# Patient Record
Sex: Female | Born: 1965 | Race: White | Hispanic: No | Marital: Married | Smoking: Never smoker
Health system: Southern US, Community
[De-identification: ages and names within clinical notes are randomized; demographics above are authoritative.]

## PROBLEM LIST (undated history)

## (undated) DIAGNOSIS — R000 Tachycardia, unspecified: Secondary | ICD-10-CM

## (undated) DIAGNOSIS — D229 Melanocytic nevi, unspecified: Secondary | ICD-10-CM

## (undated) DIAGNOSIS — E7800 Pure hypercholesterolemia, unspecified: Secondary | ICD-10-CM

## (undated) DIAGNOSIS — R7989 Other specified abnormal findings of blood chemistry: Secondary | ICD-10-CM

## (undated) DIAGNOSIS — F419 Anxiety disorder, unspecified: Secondary | ICD-10-CM

## (undated) DIAGNOSIS — I10 Essential (primary) hypertension: Secondary | ICD-10-CM

## (undated) DIAGNOSIS — F319 Bipolar disorder, unspecified: Secondary | ICD-10-CM

## (undated) DIAGNOSIS — J189 Pneumonia, unspecified organism: Secondary | ICD-10-CM

## (undated) DIAGNOSIS — M67959 Unspecified disorder of synovium and tendon, unspecified thigh: Secondary | ICD-10-CM

## (undated) DIAGNOSIS — Z8719 Personal history of other diseases of the digestive system: Secondary | ICD-10-CM

## (undated) DIAGNOSIS — G4700 Insomnia, unspecified: Secondary | ICD-10-CM

## (undated) DIAGNOSIS — K589 Irritable bowel syndrome without diarrhea: Secondary | ICD-10-CM

## (undated) DIAGNOSIS — K219 Gastro-esophageal reflux disease without esophagitis: Secondary | ICD-10-CM

## (undated) DIAGNOSIS — E210 Primary hyperparathyroidism: Secondary | ICD-10-CM

## (undated) HISTORY — DX: Other specified abnormal findings of blood chemistry: R79.89

## (undated) HISTORY — PX: SALPINGOOPHORECTOMY: SHX82

## (undated) HISTORY — DX: Gastro-esophageal reflux disease without esophagitis: K21.9

## (undated) HISTORY — DX: Melanocytic nevi, unspecified: D22.9

## (undated) HISTORY — DX: Essential (primary) hypertension: I10

## (undated) HISTORY — PX: TONSILLECTOMY: SUR1361

## (undated) HISTORY — DX: Primary hyperparathyroidism: E21.0

## (undated) HISTORY — DX: Pure hypercholesterolemia, unspecified: E78.00

## (undated) HISTORY — PX: OOPHORECTOMY: SHX86

## (undated) HISTORY — DX: Bipolar disorder, unspecified: F31.9

## (undated) HISTORY — DX: Irritable bowel syndrome, unspecified: K58.9

---

## 2003-06-17 ENCOUNTER — Other Ambulatory Visit: Admission: RE | Admit: 2003-06-17 | Discharge: 2003-06-17 | Payer: Self-pay | Admitting: Family Medicine

## 2004-06-19 ENCOUNTER — Other Ambulatory Visit: Admission: RE | Admit: 2004-06-19 | Discharge: 2004-06-19 | Payer: Self-pay | Admitting: Family Medicine

## 2005-06-27 ENCOUNTER — Other Ambulatory Visit: Admission: RE | Admit: 2005-06-27 | Discharge: 2005-06-27 | Payer: Self-pay | Admitting: Family Medicine

## 2006-07-08 ENCOUNTER — Other Ambulatory Visit: Admission: RE | Admit: 2006-07-08 | Discharge: 2006-07-08 | Payer: Self-pay | Admitting: Family Medicine

## 2007-07-14 ENCOUNTER — Other Ambulatory Visit: Admission: RE | Admit: 2007-07-14 | Discharge: 2007-07-14 | Payer: Self-pay | Admitting: Family Medicine

## 2009-07-25 ENCOUNTER — Other Ambulatory Visit: Admission: RE | Admit: 2009-07-25 | Discharge: 2009-07-25 | Payer: Self-pay | Admitting: Family Medicine

## 2010-07-26 ENCOUNTER — Other Ambulatory Visit: Admission: RE | Admit: 2010-07-26 | Discharge: 2010-07-26 | Payer: Self-pay | Admitting: Family Medicine

## 2011-03-26 ENCOUNTER — Encounter: Payer: Self-pay | Admitting: Family Medicine

## 2011-03-26 ENCOUNTER — Inpatient Hospital Stay (INDEPENDENT_AMBULATORY_CARE_PROVIDER_SITE_OTHER)
Admission: RE | Admit: 2011-03-26 | Discharge: 2011-03-26 | Disposition: A | Discharge status: Home / Self Care | Payer: BC Managed Care – PPO | Source: Ambulatory Visit | Attending: Family Medicine | Admitting: Family Medicine

## 2011-03-26 DIAGNOSIS — S335XXA Sprain of ligaments of lumbar spine, initial encounter: Secondary | ICD-10-CM

## 2011-03-28 ENCOUNTER — Telehealth (INDEPENDENT_AMBULATORY_CARE_PROVIDER_SITE_OTHER): Payer: Self-pay | Admitting: *Deleted

## 2011-08-06 ENCOUNTER — Inpatient Hospital Stay (INDEPENDENT_AMBULATORY_CARE_PROVIDER_SITE_OTHER)
Admission: RE | Admit: 2011-08-06 | Discharge: 2011-08-06 | Disposition: A | Discharge status: Home / Self Care | Payer: BC Managed Care – PPO | Source: Ambulatory Visit | Attending: Family Medicine | Admitting: Family Medicine

## 2011-08-06 ENCOUNTER — Encounter: Payer: Self-pay | Admitting: Family Medicine

## 2011-08-06 DIAGNOSIS — M549 Dorsalgia, unspecified: Secondary | ICD-10-CM

## 2011-08-06 DIAGNOSIS — R29818 Other symptoms and signs involving the nervous system: Secondary | ICD-10-CM

## 2011-08-06 DIAGNOSIS — M62838 Other muscle spasm: Secondary | ICD-10-CM

## 2011-11-12 NOTE — Progress Notes (Signed)
Summary: BACK PAIN? rm 2   Vital Signs:  Patient Profile:   45 Years Old Female CC:      Back Pain x 2 days Height:     64 inches Weight:      136.50 pounds O2 Sat:      99 % O2 treatment:    Room Air Temp:     98.7 degrees F oral Pulse rate:   73 / minute Resp:     16 per minute BP sitting:   133 / 89  (left arm) Cuff size:   regular  Pt. in pain?   yes    Location:   lower back    Intensity:   9    Type:       sharp  Vitals Entered By: Clemens Catholic LPN (March 26, 2011 10:58 AM)                   Updated Prior Medication List: Birth Control Pills Current Allergies: No known allergies History of Present Illness Chief Complaint: Back Pain x 2 days History of Present Illness:  Subjective:  Patient complains of sudden onset of sharp lower back pain 2 days ago when she bent over to pull up a week.  The pain radiates somewhat to her right buttock.  The pain is worse when sitting, climbing stairs, and bending over.  It feels better when she sleeps on a firm surface.  No bowel or bladder dysfunction.  No saddle numbness.  No abdominal pain.  No fevers, chills, and sweats.  She states that she has about two similar flareups of back pain per year.  Advil helps slightly.  REVIEW OF SYSTEMS Constitutional Symptoms      Denies fever, chills, night sweats, weight loss, weight gain, and fatigue.  Eyes       Denies change in vision, eye pain, eye discharge, glasses, contact lenses, and eye surgery. Ear/Nose/Throat/Mouth       Denies hearing loss/aids, change in hearing, ear pain, ear discharge, dizziness, frequent runny nose, frequent nose bleeds, sinus problems, sore throat, hoarseness, and tooth pain or bleeding.  Respiratory       Denies dry cough, productive cough, shortness of breath, asthma, bronchitis, and emphysema/COPD.  Cardiovascular       Denies murmurs, chest pain, and tires easily with exhertion.    Gastrointestinal       Denies stomach pain, nausea/vomiting,  diarrhea, constipation, blood in bowel movements, and indigestion. Genitourniary       Denies painful urination, kidney stones, and loss of urinary control. Neurological       Denies paralysis, seizures, and fainting/blackouts. Musculoskeletal       Complains of muscle pain, joint pain, and decreased range of motion.      Denies joint stiffness, redness, swelling, muscle weakness, and gout.  Skin       Denies bruising, unusual mles/lumps or sores, and hair/skin or nail changes.  Psych       Denies mood changes, temper/anger issues, anxiety/stress, speech problems, depression, and sleep problems. Other Comments: pt c/o low back pain , worse on the RT, radiating down her RT buttock  x Saturday. she states that she bent over and she had a muscle spasm. she took an old RX for flexeril, that was expired, and Advil with no relief.    Past History:  Past Medical History: chronic back problems  Past Surgical History: RT ovary removed   Family History: mom- breast CA  Social History: Never  Smoked Alcohol use-yes 1-2 glasses of wine per wk Drug use-no Smoking Status:  never Drug Use:  no   Objective:  Appearance:  Patient appears healthy, stated age, and in no acute distress  Eyes:  Pupils are equal, round, and reactive to light and accomdation.  Extraocular movement is intact.  Conjunctivae are not inflamed.  Neck:  Supple.  No adenopathy is present.  Lungs:  Clear to auscultation.  Breath sounds are equal.  Heart:  Regular rate and rhythm without murmurs, rubs, or gallops.  Abdomen:  Nontender without masses or hepatosplenomegaly.  Bowel sounds are present.  No CVA or flank tenderness.  Back:  Good range of motion.  Can heel/toe walk and squat without difficulty.  Tenderness in the bilateral paraspinous muscles from L1 to Sacral area, somewhat worse on the right.  Straight leg raising test is negative.  Sitting knee extension test is negative.  Strength and sensation in the lower  extremities is normal.  Patellar and achilles reflexes are normal.  Skin:  No rash Assessment New Problems: LUMBAR STRAIN, ACUTE (ICD-847.2)   Plan New Medications/Changes: LORTAB 5 5-500 MG TABS (HYDROCODONE-ACETAMINOPHEN) One or two tabs by mouth hs as needed pain  #10 (ten) x 0, 03/26/2011, Donna Christen MD CYCLOBENZAPRINE HCL 10 MG TABS (CYCLOBENZAPRINE HCL) One tab by mouth two to three times daily as needed  #20 x 1, 03/26/2011, Donna Christen MD NAPROXEN 500 MG TABS (NAPROXEN) One by mouth two times a day pc  #20 x 1, 03/26/2011, Donna Christen MD  New Orders: New Patient Level III [phone] Planning Comments:   Begin applying ice pack several times daily.  Begin Naproxen, Flexeril, analgesic at bedtime. Begin range of motion exercises in about 5 days ([facility] information and instruction patient handout given)  Followup with [facility] if not improved in two weeks.   The patient and/or caregiver has been counseled thoroughly with regard to medications prescribed including dosage, schedule, interactions, rationale for use, and possible side effects and they verbalize understanding.  Diagnoses and expected course of recovery discussed and will return if not improved as expected or if the condition worsens. Patient and/or caregiver verbalized understanding.  Prescriptions: LORTAB 5 5-500 MG TABS (HYDROCODONE-ACETAMINOPHEN) One or two tabs by mouth hs as needed pain  #10 (ten) x 0   Entered and Authorized by:   Donna Christen MD   Signed by:   Donna Christen MD on 03/26/2011   Method used:   Print then Give to Patient   RxID:   [id number] CYCLOBENZAPRINE HCL 10 MG TABS (CYCLOBENZAPRINE HCL) One tab by mouth two to three times daily as needed  #20 x 1   Entered and Authorized by:   Donna Christen MD   Signed by:   Donna Christen MD on 03/26/2011   Method used:   Print then Give to Patient   RxID:   [id number] NAPROXEN 500 MG TABS (NAPROXEN) One by mouth two  times a day pc  #20 x 1   Entered and Authorized by:   Donna Christen MD   Signed by:   Donna Christen MD on 03/26/2011   Method used:   Print then Give to Patient   RxID:   [id number]   Orders Added: 1)  New Patient Level III [47425]

## 2011-11-12 NOTE — Telephone Encounter (Signed)
  Phone Note Outgoing Call Call back at Home Phone [phone] P [facility]     Call placed by: Lajean Saver RN,  March 28, 2011 1:50 PM Call placed to: Patient Action Taken: Phone Call Completed Summary of Call: Callback: No answer. Message left to call back with questions/concerns

## 2011-11-12 NOTE — Progress Notes (Signed)
Summary: BACK PAIN...WSE (rm 4)   Vital Signs:  Patient Profile:   45 Years Old Female CC:      back Pain x 3 days Height:     64 inches Weight:      142 pounds O2 Sat:      99 % O2 treatment:    Room Air Temp:     99.0 degrees F oral Pulse rate:   80 / minute Resp:     16 per minute BP sitting:   127 / 85  (left arm) Cuff size:   regular  Pt. in pain?   yes    Location:   mid back    Intensity:   8  Vitals Entered By: Lajean Saver RN (August 06, 2011 4:23 PM)                   Prior Medication List:  NAPROXEN 500 MG TABS (NAPROXEN) One by mouth two times a day pc CYCLOBENZAPRINE HCL 10 MG TABS (CYCLOBENZAPRINE HCL) One tab by mouth two to three times daily as needed LORTAB 5 5-500 MG TABS (HYDROCODONE-ACETAMINOPHEN) One or two tabs by mouth hs as needed pain   Updated Prior Medication List: NAPROXEN 500 MG TABS (NAPROXEN) One by mouth two times a day pc CYCLOBENZAPRINE HCL 10 MG TABS (CYCLOBENZAPRINE HCL) One tab by mouth two to three times daily as needed  Current Allergies: No known allergies History of Present Illness Chief Complaint: back Pain x 3 days History of Present Illness: Patient w/recurrentback pain . Has been liftying her dog and redveloped muscle spasm and pain in her mid back.   Current Problems: BACK PAIN (ICD-724.5) MUSCULOSKELETAL PAIN (ICD-781.99) MUSCLE SPASM (ICD-728.85)   Current Meds NAPROXEN 500 MG TABS (NAPROXEN) One by mouth two times a day pc CYCLOBENZAPRINE HCL 10 MG TABS (CYCLOBENZAPRINE HCL) One tab by mouth two to three times daily as needed SKELAXIN 800 MG TABS (METAXALONE) 1 by mouthq 8 to 12 hrs for muscle spasm. If unable to aford maysubstitue norflex 100mg  by mouth twice a day do not take w/flexeril MOBIC 7.5 MG TABS (MELOXICAM) 1 by mouth q day to twice aday  take w/food do not take w/naprosyn or motrin HYDROCODONE-ACETAMINOPHEN 5-325 MG TABS (HYDROCODONE-ACETAMINOPHEN) 1/2 to 1 by mouth q 8hrs as needed for breakthrough  pain may cause sedation  REVIEW OF SYSTEMS Constitutional Symptoms      Denies fever, chills, night sweats, weight loss, weight gain, and fatigue.  Eyes       Denies change in vision, eye pain, eye discharge, glasses, contact lenses, and eye surgery. Ear/Nose/Throat/Mouth       Denies hearing loss/aids, change in hearing, ear pain, ear discharge, dizziness, frequent runny nose, frequent nose bleeds, sinus problems, sore throat, hoarseness, and tooth pain or bleeding.  Respiratory       Denies dry cough, productive cough, wheezing, shortness of breath, asthma, bronchitis, and emphysema/COPD.  Cardiovascular       Denies murmurs, chest pain, and tires easily with exhertion.    Gastrointestinal       Denies stomach pain, nausea/vomiting, diarrhea, constipation, blood in bowel movements, and indigestion. Genitourniary       Denies painful urination, blood or discharge from vagina, kidney stones, and loss of urinary control. Neurological       Denies paralysis, seizures, and fainting/blackouts. Musculoskeletal       Complains of muscle pain, joint pain, and decreased range of motion.      Denies joint stiffness, redness,  swelling, muscle weakness, and gout.  Skin       Denies bruising, unusual mles/lumps or sores, and hair/skin or nail changes.  Psych       Denies mood changes, temper/anger issues, anxiety/stress, speech problems, depression, and sleep problems. Other Comments: Patient was seen for a back strain in May. He back "flared up" again in the same place 3 days ago moving boxes @ work. She slept on the floor last night with minimal relief, has not slept in 2-3 nights. She has taken one cyclobenzaprine left over from previous visit. Could no get in to see PCP today     Past History:  Family History: Last updated: 03/26/2011 mom- breast CA  Social History: Last updated: 08/06/2011 Never Smoked Alcohol use-yes 1-2 glasses of wine per wk Drug use-no Married  Risk  Factors: Smoking Status: never (03/26/2011)  Past Medical History: Reviewed history from 03/26/2011 and no changes required. chronic back problems  Past Surgical History: Reviewed history from 03/26/2011 and no changes required. RT ovary removed   Family History: Reviewed history from 03/26/2011 and no changes required. mom- breast CA  Social History: Never Smoked Alcohol use-yes 1-2 glasses of wine per wk Drug use-no Married Physical Exam General appearance: well developed, well nourished, mild to modederate discomfort Head: normocephalic, atraumatic Back: Muscle spasm on both sides of her mid back  Skin: no obvious rashes or lesions MSE: oriented to time, place, and person Assessment New Problems: BACK PAIN (ICD-724.5) MUSCULOSKELETAL PAIN (ICD-781.99) MUSCLE SPASM (ICD-728.85)   Patient Education: Patient and/or caregiver instructed in the following: rest, fluids.  Plan New Medications/Changes: HYDROCODONE-ACETAMINOPHEN 5-325 MG TABS (HYDROCODONE-ACETAMINOPHEN) 1/2 to 1 by mouth q 8hrs as needed for breakthrough pain may cause sedation  #20 x 0, 08/06/2011, Hassan Rowan MD MOBIC 7.5 MG TABS (MELOXICAM) 1 by mouth q day to twice aday  take w/food do not take w/naprosyn or motrin  #30 x 0, 08/06/2011, Hassan Rowan MD SKELAXIN 800 MG TABS (METAXALONE) 1 by mouthq 8 to 12 hrs for muscle spasm. If unable to aford maysubstitue norflex 100mg  by mouth twice a day do not take w/flexeril  #30 x 1, 08/06/2011, Hassan Rowan MD  New Orders: New Patient Level III [phone] Follow Up: Follow up in 2-3 days if no improvement, Follow up on an as needed basis, Follow up with Primary Physician Work/School Excuse: Return to work/school tomorrow  The patient and/or caregiver has been counseled thoroughly with regard to medications prescribed including dosage, schedule, interactions, rationale for use, and possible side effects and they verbalize understanding.  Diagnoses and expected course  of recovery discussed and will return if not improved as expected or if the condition worsens. Patient and/or caregiver verbalized understanding.  Prescriptions: HYDROCODONE-ACETAMINOPHEN 5-325 MG TABS (HYDROCODONE-ACETAMINOPHEN) 1/2 to 1 by mouth q 8hrs as needed for breakthrough pain may cause sedation  #20 x 0   Entered and Authorized by:   Hassan Rowan MD   Signed by:   Hassan Rowan MD on 08/06/2011   Method used:   Print then Give to Patient   RxID:   [id number] MOBIC 7.5 MG TABS (MELOXICAM) 1 by mouth q day to twice aday  take w/food do not take w/naprosyn or motrin  #30 x 0   Entered and Authorized by:   Hassan Rowan MD   Signed by:   Hassan Rowan MD on 08/06/2011   Method used:   Print then Give to Patient   RxID:   [id number] SKELAXIN 800 MG TABS (METAXALONE)  1 by mouthq 8 to 12 hrs for muscle spasm. If unable to aford maysubstitue norflex 100mg  by mouth twice a day do not take w/flexeril  #30 x 1   Entered and Authorized by:   Hassan Rowan MD   Signed by:   Hassan Rowan MD on 08/06/2011   Method used:   Print then Give to Patient   RxID:   [id number]   Patient Instructions: 1)  Please schedule a follow-up appointment as needed. 2)  Please schedule an appointment with your primary doctor in :3to 7 days if not improving. 3)  Most patients (90%) with low back pain will improve with time (2-6 weeks). Keep active but avoid activities that are painful. Apply moist heat and/or ice to lower back several times a day.  Orders Added: 1)  New Patient Level III [14782]

## 2012-01-24 ENCOUNTER — Emergency Department
Admission: EM | Admit: 2012-01-24 | Discharge: 2012-01-24 | Disposition: A | Discharge status: Home / Self Care | Payer: BC Managed Care – PPO | Source: Home / Self Care | Attending: Family Medicine | Admitting: Family Medicine

## 2012-01-24 ENCOUNTER — Encounter: Payer: Self-pay | Admitting: *Deleted

## 2012-01-24 DIAGNOSIS — J069 Acute upper respiratory infection, unspecified: Secondary | ICD-10-CM

## 2012-01-24 MED ORDER — BENZONATATE 200 MG PO CAPS: 200.0000 mg | ORAL_CAPSULE | Freq: Every day | ORAL | Status: AC

## 2012-01-24 MED ORDER — AMOXICILLIN 875 MG PO TABS: 875.0000 mg | ORAL_TABLET | Freq: Two times a day (BID) | ORAL | Status: AC

## 2012-01-24 NOTE — Discharge Instructions (Signed)
Take plain Mucinex (guaifenesin) twice daily for cough and congestion.  Add Sudafed as needed.  Increase fluid intake. Use Afrin nasal spray (or generic oxymetazoline) twice daily for about 5 days.  Also recommend using saline nasal spray several times daily and saline nasal irrigation (AYR is a common brand).  Use Flonase spray after Afrin and Saline. Stop all antihistamines for now, and other non-prescription cough/cold preparations. Begin Amoxicillin if not improving about 5 days or if persistent fever develops. Follow-up with family doctor if not improving 7 to 10 days.

## 2012-01-24 NOTE — ED Notes (Signed)
Patient c/o sinus congestion and sinus pain x 8 days. She has taken mucinex and sudafed OTC. Denies fever.

## 2012-01-24 NOTE — ED Provider Notes (Signed)
History     CSN: [id number]  Arrival date & time 01/24/12  1553   None     Chief Complaint  Patient presents with  . Sinusitis      HPI Comments: Patient complains of approximately 7 day history of gradually progressive URI symptoms beginning with a mild sore throat (now improved), followed by progressive nasal congestion.  A cough started yesterday.  Complains of fatigue but no myalgias.  Cough is now worse at night and generally non-productive during the day.  There has been no pleuritic pain, shortness of breath, or wheezes.   The history is provided by the patient.    History reviewed. No pertinent past medical history.  Past Surgical History  Procedure Date  . Oophorectomy   . Tonsillectomy     Family History  Problem Relation Age of Onset  . Cancer Mother     breast  . Lymphoma Father   . Alzheimer's disease Father     History  Substance Use Topics  . Smoking status: Never Smoker   . Smokeless tobacco: Not on file  . Alcohol Use: Yes    OB History    Grav Para Term Preterm Abortions TAB SAB Ect Mult Living                  Review of Systems + sore throat + cough No pleuritic pain No wheezing + nasal congestion + post-nasal drainage No sinus pain/pressure No itchy/red eyes ? earache No hemoptysis No SOB No fever, + chills No nausea No vomiting No abdominal pain No diarrhea No urinary symptoms No skin rashes + fatigue No myalgias + headache Used OTC meds without relief  Allergies  Review of patient's allergies indicates no known allergies.  Home Medications   Current Outpatient Rx  Name Route Sig Dispense Refill  . AVIANE PO Oral Take by mouth.    . AMOXICILLIN 875 MG PO TABS Oral Take 1 tablet (875 mg total) by mouth 2 (two) times daily. (Rx void after 02/01/12) 20 tablet 0  . BENZONATATE 200 MG PO CAPS Oral Take 1 capsule (200 mg total) by mouth at bedtime. Take as needed for cough 12 capsule 0    BP 133/87  Pulse 93  Temp(Src)  98.3 F (36.8 C) (Oral)  Resp 14  Ht 5\' 4"  (1.626 m)  Wt 160 lb (72.576 kg)  BMI 27.46 kg/m2  SpO2 99%  Physical Exam Nursing notes and Vital Signs reviewed. Appearance:  Patient appears healthy, stated age, and in no acute distress Eyes:  Pupils are equal, round, and reactive to light and accomodation.  Extraocular movement is intact.  Conjunctivae are not inflamed  Ears:  Canals normal.  Tympanic membranes normal.  Nose:  Mildly congested turbinates.  No sinus tenderness.    Pharynx:  Normal Neck:  Supple.  Slightly tender shotty posterior nodes are palpated bilaterally  Lungs:  Clear to auscultation.  Breath sounds are equal.  Heart:  Regular rate and rhythm without murmurs, rubs, or gallops.  Abdomen:  Nontender without masses or hepatosplenomegaly.  Bowel sounds are present.  No CVA or flank tenderness.  Extremities:  No edema.  No calf tenderness Skin:  No rash present.   ED Course  Procedures none      1. Acute upper respiratory infections of unspecified site       MDM  There is no evidence of bacterial infection today.   Treat symptomatically for now  Take plain Mucinex (guaifenesin) twice daily for  cough and congestion.  Add Sudafed as needed.  Increase fluid intake. Use Afrin nasal spray (or generic oxymetazoline) twice daily for about 5 days.  Also recommend using saline nasal spray several times daily and saline nasal irrigation (AYR is a common brand).  Use Flonase spray after Afrin and Saline. Stop all antihistamines for now, and other non-prescription cough/cold preparations. Begin Amoxicillin if not improving about 5 days or if persistent fever develops (Given a prescription to hold, with an expiration date)  Follow-up with family doctor if not improving 7 to 10 days.         Donna Christen, MD 01/27/12 [phone]

## 2014-01-27 ENCOUNTER — Other Ambulatory Visit: Payer: Self-pay | Admitting: Family Medicine

## 2014-01-27 ENCOUNTER — Other Ambulatory Visit (HOSPITAL_COMMUNITY)
Admission: RE | Admit: 2014-01-27 | Discharge: 2014-01-27 | Disposition: A | Discharge status: Home / Self Care | Payer: BC Managed Care – PPO | Source: Ambulatory Visit | Attending: Family Medicine | Admitting: Family Medicine

## 2014-01-27 DIAGNOSIS — Z01419 Encounter for gynecological examination (general) (routine) without abnormal findings: Secondary | ICD-10-CM | POA: Insufficient documentation

## 2014-01-27 DIAGNOSIS — Z1151 Encounter for screening for human papillomavirus (HPV): Secondary | ICD-10-CM | POA: Insufficient documentation

## 2014-07-07 ENCOUNTER — Other Ambulatory Visit: Payer: Self-pay | Admitting: Physician Assistant

## 2015-11-01 ENCOUNTER — Emergency Department (INDEPENDENT_AMBULATORY_CARE_PROVIDER_SITE_OTHER): Payer: BC Managed Care – PPO

## 2015-11-01 ENCOUNTER — Encounter: Payer: Self-pay | Admitting: Emergency Medicine

## 2015-11-01 ENCOUNTER — Emergency Department
Admission: EM | Admit: 2015-11-01 | Discharge: 2015-11-01 | Disposition: A | Discharge status: Home / Self Care | Payer: BC Managed Care – PPO | Source: Home / Self Care | Attending: Family Medicine | Admitting: Family Medicine

## 2015-11-01 DIAGNOSIS — J040 Acute laryngitis: Secondary | ICD-10-CM

## 2015-11-01 DIAGNOSIS — R058 Other specified cough: Secondary | ICD-10-CM

## 2015-11-01 DIAGNOSIS — J329 Chronic sinusitis, unspecified: Secondary | ICD-10-CM

## 2015-11-01 DIAGNOSIS — R05 Cough: Secondary | ICD-10-CM

## 2015-11-01 DIAGNOSIS — J310 Chronic rhinitis: Secondary | ICD-10-CM

## 2015-11-01 MED ORDER — BENZONATATE 100 MG PO CAPS: 100.0000 mg | ORAL_CAPSULE | Freq: Three times a day (TID) | ORAL | Status: DC

## 2015-11-01 MED ORDER — AMOXICILLIN-POT CLAVULANATE 875-125 MG PO TABS: 1.0000 | ORAL_TABLET | Freq: Two times a day (BID) | ORAL | Status: DC

## 2015-11-01 NOTE — ED Provider Notes (Signed)
CSN: [id number]     Arrival date & time 11/01/15  1156 History   First MD Initiated Contact with Patient 11/01/15 1158     Chief Complaint  Patient presents with  . Cough   (Consider location/radiation/quality/duration/timing/severity/associated sxs/prior Treatment) HPI Pt is a 49yo female presenting to [facility] with c/o worsening mild to moderately productive cough with yellow and brown mucous for 4 days.  Pt reports fever of "almost 101*F" hoarse voice and fatigue. No sick contacts at home. Pt states symptoms started out as a sinus infection but now feels like it is in her chest. Pt states she "just doesn't feel good."  Pt c/o chest tightness and shortness of breath but denies chest pain. Denies n/v/d. Denies recent travel. Denies hx of asthma.   History reviewed. No pertinent past medical history. Past Surgical History  Procedure Laterality Date  . Oophorectomy    . Tonsillectomy     Family History  Problem Relation Age of Onset  . Cancer Mother     breast  . Lymphoma Father   . Alzheimer's disease Father    Social History  Substance Use Topics  . Smoking status: Never Smoker   . Smokeless tobacco: None  . Alcohol Use: Yes   OB History    No data available     Review of Systems  Constitutional: Positive for fever, chills and fatigue.  HENT: Positive for congestion, sore throat and voice change. Negative for ear pain and trouble swallowing.   Respiratory: Positive for cough, chest tightness, shortness of breath and wheezing.   Cardiovascular: Negative for chest pain and palpitations.  Gastrointestinal: Negative for nausea, vomiting, abdominal pain and diarrhea.  Musculoskeletal: Negative for myalgias, back pain and arthralgias.  Skin: Negative for rash.    Allergies  Review of patient's allergies indicates no known allergies.  Home Medications   Prior to Admission medications   Medication Sig Start Date End Date Taking? Authorizing Provider  clonazePAM (KLONOPIN) 1 MG  tablet Take 1 mg by mouth 2 (two) times daily.   Yes Historical Provider, MD  amoxicillin-clavulanate (AUGMENTIN) 875-125 MG tablet Take 1 tablet by mouth 2 (two) times daily. One po bid x 7 days 11/01/15   Noland Fordyce, PA-C  benzonatate (TESSALON) 100 MG capsule Take 1 capsule (100 mg total) by mouth every 8 (eight) hours. 11/01/15   Noland Fordyce, PA-C  Levonorgestrel-Ethinyl Estrad (AVIANE PO) Take by mouth.    Historical Provider, MD   Meds Ordered and Administered this Visit  Medications - No data to display  BP 127/85 mmHg  Pulse 109  Temp(Src) 97.6 F (36.4 C) (Oral)  Ht 5\' 4"  (1.626 m)  Wt 176 lb (79.833 kg)  BMI 30.20 kg/m2  SpO2 100%  LMP 10/30/2015 No data found.   Physical Exam  Constitutional: She appears well-developed and well-nourished. No distress.  HENT:  Head: Normocephalic and atraumatic.  Right Ear: Hearing, tympanic membrane, external ear and ear canal normal.  Left Ear: Hearing, tympanic membrane, external ear and ear canal normal.  Nose: Mucosal edema present.  Mouth/Throat: Uvula is midline and mucous membranes are normal. Posterior oropharyngeal erythema present. No oropharyngeal exudate, posterior oropharyngeal edema or tonsillar abscesses.  Eyes: Conjunctivae are normal. No scleral icterus.  Neck: Normal range of motion. Neck supple.  Hoarse voice but no stridor  Cardiovascular: Regular rhythm and normal heart sounds.  Tachycardia present.   Mild tachycardia, regular rhythm  Pulmonary/Chest: Effort normal. No stridor. No respiratory distress. She has wheezes. She has no  rales. She exhibits no tenderness.  Abdominal: Soft. She exhibits no distension and no mass. There is no tenderness. There is no rebound and no guarding.  Musculoskeletal: Normal range of motion.  Lymphadenopathy:    She has cervical adenopathy.  Neurological: She is alert.  Skin: Skin is warm and dry. She is not diaphoretic.  Nursing note and vitals reviewed.   ED Course   Procedures (including critical care time)  Labs Review Labs Reviewed - No data to display  Imaging Review Dg Chest 2 View  11/01/2015  CLINICAL DATA:  Cough and chest tightness EXAM: CHEST  2 VIEW COMPARISON:  03/05/2014 FINDINGS: Normal heart size and mediastinal contours. No acute infiltrate or edema. Calcified granuloma in the left lingular region. No effusion or pneumothorax. No acute osseous findings. IMPRESSION: No active cardiopulmonary disease. Electronically Signed   By: Monte Fantasia M.D.   On: 11/01/2015 12:35       MDM   1. Rhinosinusitis   2. Laryngitis   3. Productive cough    Pt c/o worsening productive cough that started as nasal congestion.  Pt also has a hoarse voice.  CXR: negative for active cardiopulmonary disease  Pt states she still has guaifenesin with codeine at home she can take. Rx: Augmentin and tessalon.  Encouraged pt to continue with cough medications, fluids, rest, acetaminophen and ibuprofen. If not improving in 4-5 days or symptoms worsen with a persistent fever, she may start antibiotic. F/u with PCP in 7-10 days if not improving or if symptoms continue to worsen after starting antibiotic. Patient verbalized understanding and agreement with treatment plan.     Noland Fordyce, PA-C 11/01/15 1249

## 2015-11-01 NOTE — ED Notes (Signed)
Dry cough, chest tightness, yellow mucus, low grade fever, hoarseness x 4 days

## 2015-11-01 NOTE — Discharge Instructions (Signed)
You may take 400-600mg  Ibuprofen (Motrin) every 6-8 hours for fever and pain  Alternate with Tylenol  You may take 500mg  Tylenol every 4-6 hours as needed for fever and pain  Follow-up with your primary care provider next week for recheck of symptoms if not improving.  Be sure to drink plenty of fluids and rest, at least 8hrs of sleep a night, preferably more while you are sick. Return urgent care or go to closest ER if you cannot keep down fluids/signs of dehydration, fever not reducing with Tylenol, difficulty breathing/wheezing, stiff neck, worsening condition, or other concerns (see below)   Please try to allow 4-5 more days for symptoms to improve with cough medications including guaifenesin and tessalon.  Be sure to stay well hydrated and take medications as frequently as indicated on the instructions.  If symptoms continue to worsen or you develop a persistent fever of greater than 100.4*F you may start the antibiotic. If you start the antibiotic, please take antibiotics as prescribed and be sure to complete entire course even if you start to feel better to ensure infection does not come back.   Cool Mist Vaporizers Vaporizers may help relieve the symptoms of a cough and cold. They add moisture to the air, which helps mucus to become thinner and less sticky. This makes it easier to breathe and cough up secretions. Cool mist vaporizers do not cause serious burns like hot mist vaporizers, which may also be called steamers or humidifiers. Vaporizers have not been proven to help with colds. You should not use a vaporizer if you are allergic to mold. HOME CARE INSTRUCTIONS  Follow the package instructions for the vaporizer.  Do not use anything other than distilled water in the vaporizer.  Do not run the vaporizer all of the time. This can cause mold or bacteria to grow in the vaporizer.  Clean the vaporizer after each time it is used.  Clean and dry the vaporizer well before storing  it.  Stop using the vaporizer if worsening respiratory symptoms develop.   This information is not intended to replace advice given to you by your health care provider. Make sure you discuss any questions you have with your health care provider.   Document Released: 08/23/2004 Document Revised: 12/01/2013 Document Reviewed: 04/15/2013 Elsevier Interactive Patient Education 2016 Else[employer] Inc.  Laryngitis Laryngitis is inflammation of your vocal cords. This causes hoarseness, coughing, loss of voice, sore throat, or a dry throat. Your vocal cords are two bands of muscles that are found in your throat. When you speak, these cords come together and vibrate. These vibrations come out through your mouth as sound. When your vocal cords are inflamed, your voice sounds different. Laryngitis can be temporary (acute) or long-term (chronic). Most cases of acute laryngitis improve with time. Chronic laryngitis is laryngitis that lasts for more than three weeks. CAUSES Acute laryngitis may be caused by:  A viral infection.  Lots of talking, yelling, or singing. This is also called vocal strain.  Bacterial infections. Chronic laryngitis may be caused by:  Vocal strain.  Injury to your vocal cords.  Acid reflux (gastroesophageal reflux disease or GERD).  Allergies.  Sinus infection.  Smoking.  Alcohol abuse.  Breathing in chemicals or dust.  Growths on the vocal cords. RISK FACTORS Risk factors for laryngitis include:  Smoking.  Alcohol abuse.  Having allergies. SIGNS AND SYMPTOMS Symptoms of laryngitis may include:  Low, hoarse voice.  Loss of voice.  Dry cough.  Sore throat.  Stuffy  nose. DIAGNOSIS Laryngitis may be diagnosed by:  Physical exam.  Throat culture.  Blood test.  Laryngoscopy. This procedure allows your health care provider to look at your vocal cords with a mirror or viewing tube. TREATMENT Treatment for laryngitis depends on what is causing it.  Usually, treatment involves resting your voice and using medicines to soothe your throat. However, if your laryngitis is caused by a bacterial infection, you may need to take antibiotic medicine. If your laryngitis is caused by a growth, you may need to have a procedure to remove it. HOME CARE INSTRUCTIONS  Drink enough fluid to keep your urine clear or pale yellow.  Breathe in moist air. Use a humidifier if you live in a dry climate.  Take medicines only as directed by your health care provider.  If you were prescribed an antibiotic medicine, finish it all even if you start to feel better.  Do not smoke cigarettes or electronic cigarettes. If you need help quitting, ask your health care provider.  Talk as little as possible. Also avoid whispering, which can cause vocal strain.  Write instead of talking. Do this until your voice is back to normal. SEEK MEDICAL CARE IF:  You have a fever.  You have increasing pain.  You have difficulty swallowing. SEEK IMMEDIATE MEDICAL CARE IF:  You cough up blood.  You have trouble breathing.   This information is not intended to replace advice given to you by your health care provider. Make sure you discuss any questions you have with your health care provider.   Document Released: 11/26/2005 Document Revised: 12/17/2014 Document Reviewed: 05/11/2014 Elsevier Interactive Patient Education 2016 Elsevier Inc.  Cough, Adult A cough helps to clear your throat and lungs. A cough may last only 2-3 weeks (acute), or it may last longer than 8 weeks (chronic). Many different things can cause a cough. A cough may be a sign of an illness or another medical condition. HOME CARE  Pay attention to any changes in your cough.  Take medicines only as told by your doctor.  If you were prescribed an antibiotic medicine, take it as told by your doctor. Do not stop taking it even if you start to feel better.  Talk with your doctor before you try using a  cough medicine.  Drink enough fluid to keep your pee (urine) clear or pale yellow.  If the air is dry, use a cold steam vaporizer or humidifier in your home.  Stay away from things that make you cough at work or at home.  If your cough is worse at night, try using extra pillows to raise your head up higher while you sleep.  Do not smoke, and try not to be around smoke. If you need help quitting, ask your doctor.  Do not have caffeine.  Do not drink alcohol.  Rest as needed. GET HELP IF:  You have new problems (symptoms).  You cough up yellow fluid (pus).  Your cough does not get better after 2-3 weeks, or your cough gets worse.  Medicine does not help your cough and you are not sleeping well.  You have pain that gets worse or pain that is not helped with medicine.  You have a fever.  You are losing weight and you do not know why.  You have night sweats. GET HELP RIGHT AWAY IF:  You cough up blood.  You have trouble breathing.  Your heartbeat is very fast.   This information is not intended to replace  advice given to you by your health care provider. Make sure you discuss any questions you have with your health care provider. °  °Document Released: 08/09/2011 Document Revised: 08/17/2015 Document Reviewed: 02/02/2015 °Elsevier Interactive Patient Education ©2016 Elsevier Inc. ° °

## 2016-01-17 ENCOUNTER — Emergency Department
Admission: EM | Admit: 2016-01-17 | Discharge: 2016-01-17 | Disposition: A | Discharge status: Home / Self Care | Payer: BC Managed Care – PPO | Source: Home / Self Care | Attending: Family Medicine | Admitting: Family Medicine

## 2016-01-17 ENCOUNTER — Encounter: Payer: Self-pay | Admitting: *Deleted

## 2016-01-17 DIAGNOSIS — J0101 Acute recurrent maxillary sinusitis: Secondary | ICD-10-CM

## 2016-01-17 HISTORY — DX: Insomnia, unspecified: G47.00

## 2016-01-17 MED ORDER — SALINE SPRAY 0.65 % NA SOLN: 1.0000 | NASAL | Status: DC | PRN

## 2016-01-17 MED ORDER — AMOXICILLIN-POT CLAVULANATE 875-125 MG PO TABS: 1.0000 | ORAL_TABLET | Freq: Two times a day (BID) | ORAL | Status: DC

## 2016-01-17 NOTE — Discharge Instructions (Signed)
You may take 400-600mg  Ibuprofen (Motrin) every 6-8 hours for fever and pain  Alternate with Tylenol  You may take 500mg  Tylenol every 4-6 hours as needed for fever and pain  Follow-up with your primary care provider next week for recheck of symptoms if not improving.  Be sure to drink plenty of fluids and rest, at least 8hrs of sleep a night, preferably more while you are sick. Return urgent care or go to closest ER if you cannot keep down fluids/signs of dehydration, fever not reducing with Tylenol, difficulty breathing/wheezing, stiff neck, worsening condition, or other concerns (see below)  Please take antibiotics as prescribed and be sure to complete entire course even if you start to feel better to ensure infection does not come back.   Sinus Rinse WHAT IS A SINUS RINSE? A sinus rinse is a simple home treatment that is used to rinse your sinuses with a sterile mixture of salt and water (saline solution). Sinuses are air-filled spaces in your skull behind the bones of your face and forehead that open into your nasal cavity. You will use the following:  Saline solution.  Neti pot or spray bottle. This releases the saline solution into your nose and through your sinuses. Neti pots and spray bottles can be purchased at [employer], a health food store, or online. WHEN WOULD I DO A SINUS RINSE? A sinus rinse can help to clear mucus, dirt, dust, or pollen from the nasal cavity. You may do a sinus rinse when you have a cold, a virus, nasal allergy symptoms, a sinus infection, or stuffiness in the nose or sinuses. If you are considering a sinus rinse:  Ask your child's health care provider before performing a sinus rinse on your child.  Do not do a sinus rinse if you have had ear or nasal surgery, ear infection, or blocked ears. HOW DO I DO A SINUS RINSE?  Wash your hands.  Disinfect your device according to the directions provided and then dry it.  Use the solution that comes  with your device or one that is sold separately in stores. Follow the mixing directions on the package.  Fill your device with the amount of saline solution as directed by the device instructions.  Stand over a sink and tilt your head sideways over the sink.  Place the spout of the device in your upper nostril (the one closer to the ceiling).  Gently pour or squeeze the saline solution into the nasal cavity. The liquid should drain to the lower nostril if you are not overly congested.  Gently blow your nose. Blowing too hard may cause ear pain.  Repeat in the other nostril.  Clean and rinse your device with clean water and then air-dry it. ARE THERE RISKS OF A SINUS RINSE?  Sinus rinse is generally very safe and effective. However, there are a few risks, which include:   A burning sensation in the sinuses. This may happen if you do not make the saline solution as directed. Make sure to follow all directions when making the saline solution.  Infection from contaminated water. This is rare, but possible.  Nasal irritation.   This information is not intended to replace advice given to you by your health care provider. Make sure you discuss any questions you have with your health care provider.   Document Released: 06/23/2014 Document Reviewed: 06/23/2014 Elsevier Interactive Patient Education 2016 [employer].  Sinusitis, Adult Sinusitis is redness, soreness, and inflammation of the paranasal sinuses.  Paranasal sinuses are air pockets within the bones of your face. They are located beneath your eyes, in the middle of your forehead, and above your eyes. In healthy paranasal sinuses, mucus is able to drain out, and air is able to circulate through them by way of your nose. However, when your paranasal sinuses are inflamed, mucus and air can become trapped. This can allow bacteria and other germs to grow and cause infection. Sinusitis can develop quickly and last only a short time (acute)  or continue over a long period (chronic). Sinusitis that lasts for more than 12 weeks is considered chronic. CAUSES Causes of sinusitis include:  Allergies.  Structural abnormalities, such as displacement of the cartilage that separates your nostrils (deviated septum), which can decrease the air flow through your nose and sinuses and affect sinus drainage.  Functional abnormalities, such as when the small hairs (cilia) that line your sinuses and help remove mucus do not work properly or are not present. SIGNS AND SYMPTOMS Symptoms of acute and chronic sinusitis are the same. The primary symptoms are pain and pressure around the affected sinuses. Other symptoms include:  Upper toothache.  Earache.  Headache.  Bad breath.  Decreased sense of smell and taste.  A cough, which worsens when you are lying flat.  Fatigue.  Fever.  Thick drainage from your nose, which often is green and may contain pus (purulent).  Swelling and warmth over the affected sinuses. DIAGNOSIS Your health care provider will perform a physical exam. During your exam, your health care provider may perform any of the following to help determine if you have acute sinusitis or chronic sinusitis:  Look in your nose for signs of abnormal growths in your nostrils (nasal polyps).  Tap over the affected sinus to check for signs of infection.  View the inside of your sinuses using an imaging device that has a light attached (endoscope). If your health care provider suspects that you have chronic sinusitis, one or more of the following tests may be recommended:  Allergy tests.  Nasal culture. A sample of mucus is taken from your nose, sent to a lab, and screened for bacteria.  Nasal cytology. A sample of mucus is taken from your nose and examined by your health care provider to determine if your sinusitis is related to an allergy. TREATMENT Most cases of acute sinusitis are related to a viral infection and will  resolve on their own within 10 days. Sometimes, medicines are prescribed to help relieve symptoms of both acute and chronic sinusitis. These may include pain medicines, decongestants, nasal steroid sprays, or saline sprays. However, for sinusitis related to a bacterial infection, your health care provider will prescribe antibiotic medicines. These are medicines that will help kill the bacteria causing the infection. Rarely, sinusitis is caused by a fungal infection. In these cases, your health care provider will prescribe antifungal medicine. For some cases of chronic sinusitis, surgery is needed. Generally, these are cases in which sinusitis recurs more than 3 times per year, despite other treatments. HOME CARE INSTRUCTIONS  Drink plenty of water. Water helps thin the mucus so your sinuses can drain more easily.  Use a humidifier.  Inhale steam 3-4 times a day (for example, sit in the bathroom with the shower running).  Apply a warm, moist washcloth to your face 3-4 times a day, or as directed by your health care provider.  Use saline nasal sprays to help moisten and clean your sinuses.  Take medicines only as directed  by your health care provider.  If you were prescribed either an antibiotic or antifungal medicine, finish it all even if you start to feel better. SEEK IMMEDIATE MEDICAL CARE IF:  You have increasing pain or severe headaches.  You have nausea, vomiting, or drowsiness.  You have swelling around your face.  You have vision problems.  You have a stiff neck.  You have difficulty breathing.   This information is not intended to replace advice given to you by your health care provider. Make sure you discuss any questions you have with your health care provider.   Document Released: 11/26/2005 Document Revised: 12/17/2014 Document Reviewed: 12/11/2011 Elsevier Interactive Patient Education [employer].

## 2016-01-17 NOTE — ED Notes (Signed)
Pt c/o nasal congestion, sinus pain/pressure and yellow/brown nasal d/c x 3 wks. Denies fever. Reports chills last night.

## 2016-01-17 NOTE — ED Provider Notes (Signed)
CSN: [id number]     Arrival date & time 01/17/16  [id number] History   First MD Initiated Contact with Patient 01/17/16 [phone]     Chief Complaint  Patient presents with  . Nasal Congestion   (Consider location/radiation/quality/duration/timing/severity/associated sxs/prior Treatment) HPI  Pt is a 50yo female presenting to [facility] with c/o 3 week hx of gradually worsening sinus congestion, pressure, and nasal discharge that is thick yellow/brown in color.  She has been using OTC nasal Zicam, Mucinex and Sudafed. Symptoms do improve temporarily.  Subjective low grade fever with chills when symptoms first started. Denies sick contacts or recent travel. Denies n/v/d.   Past Medical History  Diagnosis Date  . Insomnia    Past Surgical History  Procedure Laterality Date  . Tonsillectomy    . Oophorectomy Right    Family History  Problem Relation Age of Onset  . Cancer Mother     breast  . Lymphoma Father   . Alzheimer's disease Father    Social History  Substance Use Topics  . Smoking status: Never Smoker   . Smokeless tobacco: None  . Alcohol Use: Yes   OB History    No data available     Review of Systems  Constitutional: Positive for fever and chills.  HENT: Positive for congestion, rhinorrhea, sinus pressure and sneezing. Negative for ear pain, sore throat, trouble swallowing and voice change.   Eyes: Positive for pain (pressure). Negative for photophobia, discharge, redness, itching and visual disturbance.  Respiratory: Negative for cough and shortness of breath.   Cardiovascular: Negative for chest pain and palpitations.  Gastrointestinal: Negative for nausea, vomiting, abdominal pain and diarrhea.  Musculoskeletal: Positive for myalgias and arthralgias. Negative for back pain.  Skin: Negative for rash.  Neurological: Positive for headaches. Negative for dizziness and light-headedness.    Allergies  Review of patient's allergies indicates no known allergies.  Home Medications    Prior to Admission medications   Medication Sig Start Date End Date Taking? Authorizing Provider  amoxicillin-clavulanate (AUGMENTIN) 875-125 MG tablet Take 1 tablet by mouth 2 (two) times daily. One po bid x 7 days 01/17/16   Noland Fordyce, PA-C  clonazePAM (KLONOPIN) 1 MG tablet Take 1 mg by mouth 2 (two) times daily.    Historical Provider, MD  Levonorgestrel-Ethinyl Estrad (AVIANE PO) Take by mouth.    Historical Provider, MD  sodium chloride (OCEAN) 0.65 % SOLN nasal spray Place 1 spray into both nostrils as needed for congestion. 01/17/16   Noland Fordyce, PA-C   Meds Ordered and Administered this Visit  Medications - No data to display  BP 137/89 mmHg  Pulse 96  Temp(Src) 98.2 F (36.8 C) (Oral)  Resp 16  Ht 5\' 4"  (1.626 m)  Wt 160 lb (72.576 kg)  BMI 27.45 kg/m2  SpO2 99%  LMP 12/23/2015 No data found.   Physical Exam  Constitutional: She appears well-developed and well-nourished. No distress.  HENT:  Head: Normocephalic and atraumatic.  Right Ear: Hearing, tympanic membrane, external ear and ear canal normal.  Left Ear: Hearing, tympanic membrane, external ear and ear canal normal.  Nose: Mucosal edema present. Right sinus exhibits maxillary sinus tenderness. Right sinus exhibits no frontal sinus tenderness. Left sinus exhibits maxillary sinus tenderness. Left sinus exhibits no frontal sinus tenderness.  Mouth/Throat: Uvula is midline, oropharynx is clear and moist and mucous membranes are normal.  Eyes: Conjunctivae are normal. No scleral icterus.  Neck: Normal range of motion. Neck supple.  Cardiovascular: Normal rate, regular rhythm and  normal heart sounds.   Pulmonary/Chest: Effort normal and breath sounds normal. No stridor. No respiratory distress. She has no wheezes. She has no rales. She exhibits no tenderness.  Abdominal: Soft. She exhibits no distension. There is no tenderness.  Musculoskeletal: Normal range of motion.  Lymphadenopathy:    She has no cervical  adenopathy.  Neurological: She is alert.  Skin: Skin is warm and dry. She is not diaphoretic.  Nursing note and vitals reviewed.   ED Course  Procedures (including critical care time)  Labs Review Labs Reviewed - No data to display  Imaging Review No results found.    MDM   1. Acute recurrent maxillary sinusitis    Pt c/o 3 weeks of worsening nasal congestion with frontal headaches and thick mucus. Sinus tenderness on exam.   Exam c/w bacterial maxillary sinusitis.  Rx: augmentin and saline nasal spray.  Advised pt to use acetaminophen and ibuprofen as needed for fever and pain. Encouraged rest and fluids. F/u with PCP in 7-10 days if not improving, sooner if worsening. Pt verbalized understanding and agreement with tx plan.     Noland Fordyce, PA-C 01/17/16 [phone]

## 2016-09-19 ENCOUNTER — Emergency Department
Admission: EM | Admit: 2016-09-19 | Discharge: 2016-09-19 | Disposition: A | Discharge status: Home / Self Care | Payer: BC Managed Care – PPO | Source: Home / Self Care | Attending: Family Medicine | Admitting: Family Medicine

## 2016-09-19 ENCOUNTER — Encounter: Payer: Self-pay | Admitting: Emergency Medicine

## 2016-09-19 DIAGNOSIS — R062 Wheezing: Secondary | ICD-10-CM

## 2016-09-19 DIAGNOSIS — J329 Chronic sinusitis, unspecified: Secondary | ICD-10-CM

## 2016-09-19 DIAGNOSIS — J069 Acute upper respiratory infection, unspecified: Secondary | ICD-10-CM

## 2016-09-19 DIAGNOSIS — R030 Elevated blood-pressure reading, without diagnosis of hypertension: Secondary | ICD-10-CM

## 2016-09-19 MED ORDER — BENZONATATE 100 MG PO CAPS: 100.0000 mg | ORAL_CAPSULE | Freq: Three times a day (TID) | ORAL | 0 refills | Status: DC

## 2016-09-19 MED ORDER — ALBUTEROL SULFATE HFA 108 (90 BASE) MCG/ACT IN AERS: 1.0000 | INHALATION_SPRAY | Freq: Four times a day (QID) | RESPIRATORY_TRACT | 0 refills | Status: DC | PRN

## 2016-09-19 MED ORDER — AMOXICILLIN-POT CLAVULANATE 875-125 MG PO TABS: 1.0000 | ORAL_TABLET | Freq: Two times a day (BID) | ORAL | 0 refills | Status: DC

## 2016-09-19 NOTE — ED Triage Notes (Signed)
Sinus pain, pressure, headache, fever, chills, cough with brown mucus, x 2 weeks worse last 4 days

## 2016-09-19 NOTE — ED Provider Notes (Signed)
CSN: [id number]     Arrival date & time 09/19/16  1506 History   First MD Initiated Contact with Patient 09/19/16 1541     Chief Complaint  Patient presents with  . Sinus Problem   (Consider location/radiation/quality/duration/timing/severity/associated sxs/prior Treatment) HPI  Catherine Fry is a 50 y.o. female presenting to UC with c/o gradually worsening sinus pain and pressure, subjective fever, chills, mildly to productive cough with brown thick mucous for about 2 weeks, worsening over the last 4 days. She has tried OTC cough medication with minimal relief. She also c/o bilateral ear fullness. Denies n/v/d.   BP mildly elevated in triage. Pt notes she normally has low BP but notes she does feel more anxious today.  Past Medical History:  Diagnosis Date  . Insomnia    Past Surgical History:  Procedure Laterality Date  . OOPHORECTOMY Right   . TONSILLECTOMY     Family History  Problem Relation Age of Onset  . Cancer Mother     breast  . Lymphoma Father   . Alzheimer's disease Father    Social History  Substance Use Topics  . Smoking status: Never Smoker  . Smokeless tobacco: Never Used  . Alcohol use Yes   OB History    No data available     Review of Systems  Constitutional: Positive for chills and fever.  HENT: Positive for congestion, ear pain, postnasal drip, rhinorrhea, sinus pressure and sore throat. Negative for trouble swallowing and voice change.   Respiratory: Positive for cough. Negative for shortness of breath.   Cardiovascular: Negative for chest pain and palpitations.  Gastrointestinal: Negative for abdominal pain, diarrhea, nausea and vomiting.  Musculoskeletal: Negative for arthralgias, back pain and myalgias.  Skin: Negative for rash.  Neurological: Positive for headaches. Negative for dizziness and light-headedness.    Allergies  Review of patient's allergies indicates no known allergies.  Home Medications   Prior to Admission medications    Medication Sig Start Date End Date Taking? Authorizing Provider  albuterol (PROVENTIL HFA;VENTOLIN HFA) 108 (90 Base) MCG/ACT inhaler Inhale 1-2 puffs into the lungs every 6 (six) hours as needed for wheezing or shortness of breath. 09/19/16   Noland Fordyce, PA-C  amoxicillin-clavulanate (AUGMENTIN) 875-125 MG tablet Take 1 tablet by mouth 2 (two) times daily. One po bid x 7 days 09/19/16   Noland Fordyce, PA-C  benzonatate (TESSALON) 100 MG capsule Take 1-2 capsules (100-200 mg total) by mouth every 8 (eight) hours. 09/19/16   Noland Fordyce, PA-C  clonazePAM (KLONOPIN) 1 MG tablet Take 1 mg by mouth 2 (two) times daily.    Historical Provider, MD   Meds Ordered and Administered this Visit  Medications - No data to display  BP 135/91 (BP Location: Right Arm)   Pulse 109   Temp 100.8 F (38.2 C) (Oral)   Ht 5\' 4"  (1.626 m)   Wt 174 lb (78.9 kg)   LMP 09/10/2016 (Exact Date)   SpO2 99%   BMI 29.87 kg/m  No data found.   Physical Exam  Constitutional: She appears well-developed and well-nourished. No distress.  HENT:  Head: Normocephalic and atraumatic.  Right Ear: Tympanic membrane normal.  Left Ear: Tympanic membrane normal.  Nose: Mucosal edema present. Right sinus exhibits maxillary sinus tenderness and frontal sinus tenderness. Left sinus exhibits maxillary sinus tenderness and frontal sinus tenderness.  Mouth/Throat: Uvula is midline, oropharynx is clear and moist and mucous membranes are normal.  Eyes: Conjunctivae are normal. No scleral icterus.  Neck: Normal  range of motion. Neck supple.  Cardiovascular: Normal rate, regular rhythm and normal heart sounds.   Pulmonary/Chest: Effort normal. No respiratory distress. She has wheezes. She has no rales.  Faint expiratory wheeze in upper lung fields. No rhonchi. No respiratory distress.  Abdominal: Soft. She exhibits no distension. There is no tenderness.  Musculoskeletal: Normal range of motion.  Neurological: She is alert.   Skin: Skin is warm and dry. She is not diaphoretic.  Nursing note and vitals reviewed.   Urgent Care Course   Clinical Course    Procedures (including critical care time)  Labs Review Labs Reviewed - No data to display  Imaging Review No results found.   MDM   1. Recurrent sinusitis   2. Upper respiratory tract infection, unspecified type   3. Wheeze   4. Elevated blood pressure reading    Pt c/o worsening sinus congestion with productive cough with brown mucous.  Wheeze noted on exam. Sinus tenderness on exam  Rx: Augmentin, tessalon, and albuterol  Advised pt to use acetaminophen and ibuprofen as needed for fever and pain. Encouraged rest and fluids. F/u with PCP in 7-10 days if not improving, sooner if worsening. Pt verbalized understanding and agreement with tx plan.     Noland Fordyce, PA-C 09/19/16 1658

## 2016-11-27 ENCOUNTER — Other Ambulatory Visit (HOSPITAL_COMMUNITY)
Admission: RE | Admit: 2016-11-27 | Discharge: 2016-11-27 | Disposition: A | Discharge status: Home / Self Care | Payer: BC Managed Care – PPO | Source: Ambulatory Visit | Attending: Nurse Practitioner | Admitting: Nurse Practitioner

## 2016-11-27 ENCOUNTER — Other Ambulatory Visit: Payer: Self-pay | Admitting: Nurse Practitioner

## 2016-11-27 DIAGNOSIS — Z1151 Encounter for screening for human papillomavirus (HPV): Secondary | ICD-10-CM | POA: Diagnosis not present

## 2016-11-27 DIAGNOSIS — Z01419 Encounter for gynecological examination (general) (routine) without abnormal findings: Secondary | ICD-10-CM | POA: Insufficient documentation

## 2016-11-29 LAB — CYTOLOGY - PAP
Diagnosis: NEGATIVE
HPV: NOT DETECTED

## 2016-12-15 ENCOUNTER — Emergency Department
Admission: EM | Admit: 2016-12-15 | Discharge: 2016-12-15 | Disposition: A | Discharge status: Home / Self Care | Payer: BC Managed Care – PPO | Source: Home / Self Care | Attending: Family Medicine | Admitting: Family Medicine

## 2016-12-15 DIAGNOSIS — B9789 Other viral agents as the cause of diseases classified elsewhere: Secondary | ICD-10-CM

## 2016-12-15 DIAGNOSIS — J069 Acute upper respiratory infection, unspecified: Secondary | ICD-10-CM | POA: Diagnosis not present

## 2016-12-15 MED ORDER — AZITHROMYCIN 250 MG PO TABS: ORAL_TABLET | ORAL | 0 refills | Status: DC

## 2016-12-15 NOTE — ED Provider Notes (Signed)
[name]    CSN: [id number] Arrival date & time: 12/15/16  1148     History   Chief Complaint No chief complaint on file.   HPI Catherine Fry is a 51 y.o. female.   Patient has had increased sinus congestion and cough for two weeks.  She has become fatigued but has not had fever.  She occasionally wheezes and has felt tightness in her anterior chest.  No pleuritic pain.   The history is provided by the patient.    Past Medical History:  Diagnosis Date  . Insomnia     There are no active problems to display for this patient.   Past Surgical History:  Procedure Laterality Date  . OOPHORECTOMY Right   . TONSILLECTOMY      OB History    No data available       Home Medications    Prior to Admission medications   Medication Sig Start Date End Date Taking? Authorizing Provider  albuterol (PROVENTIL HFA;VENTOLIN HFA) 108 (90 Base) MCG/ACT inhaler Inhale 1-2 puffs into the lungs every 6 (six) hours as needed for wheezing or shortness of breath. 09/19/16  Yes Noland Fordyce, PA-C  clonazePAM (KLONOPIN) 1 MG tablet Take 1 mg by mouth 2 (two) times daily.   Yes Historical Provider, MD  hydrochlorothiazide (MICROZIDE) 12.5 MG capsule Take 12.5 mg by mouth daily.   Yes Historical Provider, MD  azithromycin (ZITHROMAX Z-PAK) 250 MG tablet Take 2 tabs today; then begin one tab once daily for 4 more days. 12/15/16   Kandra Nicolas, MD  benzonatate (TESSALON) 100 MG capsule Take 1-2 capsules (100-200 mg total) by mouth every 8 (eight) hours. 09/19/16   Noland Fordyce, PA-C    Family History Family History  Problem Relation Age of Onset  . Cancer Mother     breast  . Lymphoma Father   . Alzheimer's disease Father     Social History Social History  Substance Use Topics  . Smoking status: Never Smoker  . Smokeless tobacco: Never Used  . Alcohol use Yes     Allergies   Patient has no known allergies.   Review of Systems Review of Systems No sore  throat + cough No pleuritic pain ? wheezing + nasal congestion + post-nasal drainage No sinus pain/pressure No itchy/red eyes No earache No hemoptysis No SOB No fever/chills No nausea No vomiting No abdominal pain No diarrhea No urinary symptoms No skin rash + fatigue No myalgias No headache Used OTC meds without relief   Physical Exam Triage Vital Signs ED Triage Vitals  Enc Vitals Group     BP      Pulse      Resp      Temp      Temp src      SpO2      Weight      Height      Head Circumference      Peak Flow      Pain Score      Pain Loc      Pain Edu?      Excl. in [place]?    No data found.   Updated Vital Signs There were no vitals taken for this visit.  Visual Acuity Right Eye Distance:   Left Eye Distance:   Bilateral Distance:    Right Eye Near:   Left Eye Near:    Bilateral Near:     Physical Exam Nursing notes and Vital Signs reviewed.  Appearance:  Patient appears stated age, and in no acute distress Eyes:  Pupils are equal, round, and reactive to light and accomodation.  Extraocular movement is intact.  Conjunctivae are not inflamed  Ears:  Canals normal.  Tympanic membranes normal.  Nose:  Mildly congested turbinates.  No sinus tenderness.   Pharynx:  Normal Neck:  Supple.  Tender enlarged posterior/lateral nodes are palpated bilaterally  Lungs:  Clear to auscultation.  Breath sounds are equal.  Moving air well. Heart:  Regular rate and rhythm without murmurs, rubs, or gallops.  Abdomen:  Nontender without masses or hepatosplenomegaly.  Bowel sounds are present.  No CVA or flank tenderness.  Extremities:  No edema.  Skin:  No rash present.    UC Treatments / Results  Labs (all labs ordered are listed, but only abnormal results are displayed) Labs Reviewed - No data to display  EKG  EKG Interpretation None       Radiology No results found.  Procedures Procedures (including critical care time)  Medications Ordered in  UC Medications - No data to display   Initial Impression / Assessment and Plan / UC Course  I have reviewed the triage vital signs and the nursing notes.  Pertinent labs & imaging results that were available during my care of the patient were reviewed by me and considered in my medical decision making (see chart for details).  Clinical Course   Begin Z-pak for atypical coverage. Continue plain guaifenesin (1200mg  extended release tabs such as Mucinex) twice daily, with plenty of water, for cough and congestion. Get adequate rest.   May use Afrin nasal spray (or generic oxymetazoline) twice daily for about 5 days and then discontinue.  Also recommend using saline nasal spray several times daily and saline nasal irrigation (AYR is a common brand).  Use Flonase nasal spray each morning after using Afrin nasal spray and saline nasal irrigation. Try warm salt water gargles for sore throat.  Stop all antihistamines for now, and other non-prescription cough/cold preparations. May continue Tessalon at bedtime for cough.     Final Clinical Impressions(s) / UC Diagnoses   Final diagnoses:  Viral URI    New Prescriptions New Prescriptions   AZITHROMYCIN (ZITHROMAX Z-PAK) 250 MG TABLET    Take 2 tabs today; then begin one tab once daily for 4 more days.     Kandra Nicolas, MD 01/01/17 [phone]

## 2016-12-15 NOTE — Discharge Instructions (Signed)
Continue plain guaifenesin (1200mg  extended release tabs such as Mucinex) twice daily, with plenty of water, for cough and congestion. Get adequate rest.   May use Afrin nasal spray (or generic oxymetazoline) twice daily for about 5 days and then discontinue.  Also recommend using saline nasal spray several times daily and saline nasal irrigation (AYR is a common brand).  Use Flonase nasal spray each morning after using Afrin nasal spray and saline nasal irrigation. Try warm salt water gargles for sore throat.  Stop all antihistamines for now, and other non-prescription cough/cold preparations. May continue Tessalon at bedtime for cough.

## 2016-12-25 DIAGNOSIS — I10 Essential (primary) hypertension: Secondary | ICD-10-CM | POA: Insufficient documentation

## 2018-02-06 ENCOUNTER — Emergency Department
Admission: EM | Admit: 2018-02-06 | Discharge: 2018-02-06 | Disposition: A | Discharge status: Home / Self Care | Payer: BC Managed Care – PPO | Source: Home / Self Care | Attending: Emergency Medicine | Admitting: Emergency Medicine

## 2018-02-06 ENCOUNTER — Encounter: Payer: Self-pay | Admitting: *Deleted

## 2018-02-06 ENCOUNTER — Other Ambulatory Visit: Payer: Self-pay

## 2018-02-06 DIAGNOSIS — J0101 Acute recurrent maxillary sinusitis: Secondary | ICD-10-CM | POA: Diagnosis not present

## 2018-02-06 HISTORY — DX: Tachycardia, unspecified: R00.0

## 2018-02-06 MED ORDER — AMOXICILLIN-POT CLAVULANATE 875-125 MG PO TABS: 1.0000 | ORAL_TABLET | Freq: Two times a day (BID) | ORAL | 0 refills | Status: DC

## 2018-02-06 NOTE — ED Triage Notes (Signed)
Patient c/o 1 week of productive cough, sinus pressure, aches and congestion. No flu vac this season.

## 2018-02-06 NOTE — ED Provider Notes (Signed)
[name]    CSN: [id number] Arrival date & time: 02/06/18  1118     History   Chief Complaint Chief Complaint  Patient presents with  . Sinus Problem  . Cough    HPI Catherine Fry is a 52 y.o. female.   HPI  Onset :8 days Facial/sinus pressure with discolored nasal mucus.    Severity: moderate, progressively worsening, now severe. Tried OTC meds without significant relief. Over the years, history of recurrent sinus infections.  Symptoms:  + Fever  + URI prodrome with nasal congestion + Minimal swollen neck glands + mild Sinus Headache + mild bilateral ear pressure  No Allergy symptoms No significant Sore Throat No eye symptoms     No significant Cough No chest pain No shortness of breath  No wheezing  No Abdominal Pain No Nausea No Vomiting No diarrhea  The symptoms may have started 1 week ago with flulike symptoms of myalgias and fever but the myalgias and fever have resolved several days ago. No focal neurologic symptoms No syncope No Rash  No Urinary symptoms    Past Medical History:  Diagnosis Date  . Insomnia   . Tachycardia     There are no active problems to display for this patient.   Past Surgical History:  Procedure Laterality Date  . OOPHORECTOMY Right   . TONSILLECTOMY      OB History    No data available       Home Medications    Prior to Admission medications   Medication Sig Start Date End Date Taking? Authorizing Provider  clonazePAM (KLONOPIN) 1 MG tablet Take 1 mg by mouth 2 (two) times daily.   Yes [provider]  Escitalopram Oxalate (LEXAPRO PO) Take by mouth.   Yes [provider]  metoprolol tartrate (LOPRESSOR) 25 MG tablet Take by mouth 2 (two) times daily.   Yes [provider]  amoxicillin-clavulanate (AUGMENTIN) 875-125 MG tablet Take 1 tablet by mouth every 12 (twelve) hours. Take for 10 days. Take with food. 02/06/18   Jacqulyn Cane, MD    Family  History Family History  Problem Relation Age of Onset  . Cancer Mother        breast  . Lymphoma Father   . Alzheimer's disease Father     Social History Social History   Tobacco Use  . Smoking status: Never Smoker  . Smokeless tobacco: Never Used  Substance Use Topics  . Alcohol use: Yes  . Drug use: No     Allergies   Patient has no known allergies.   Review of Systems Review of Systems Remainder of Review of Systems negative except as noted in the HPI.  Physical Exam Triage Vital Signs ED Triage Vitals [02/06/18 1142]  Enc Vitals Group     BP 114/80     Pulse Rate 82     Resp 16     Temp 98.9 F (37.2 C)     Temp Source Oral     SpO2 100 %     Weight 168 lb (76.2 kg)     Height      Head Circumference      Peak Flow      Pain Score 0     Pain Loc      Pain Edu?      Excl. in [place]?    No data found.  Updated Vital Signs BP 114/80 (BP Location: Left Arm)   Pulse 82   Temp 98.9  F (37.2 C) (Oral)   Resp 16   Wt 168 lb (76.2 kg)   SpO2 100%   BMI 28.84 kg/m   Visual Acuity Right Eye Distance:   Left Eye Distance:   Bilateral Distance:    Right Eye Near:   Left Eye Near:    Bilateral Near:     Physical Exam  Constitutional: She is oriented to person, place, and time. She appears well-developed and well-nourished. No distress.  HENT:  Head: Normocephalic and atraumatic.  Right Ear: Tympanic membrane, external ear and ear canal normal.  Left Ear: Tympanic membrane, external ear and ear canal normal.  Nose: Mucosal edema and rhinorrhea present. Right sinus exhibits maxillary sinus tenderness. Left sinus exhibits maxillary sinus tenderness.  Mouth/Throat: Oropharynx is clear and moist. No oral lesions. No oropharyngeal exudate.  Eyes: Right eye exhibits no discharge. Left eye exhibits no discharge. No scleral icterus.  Neck: Neck supple.  Cardiovascular: Normal rate, regular rhythm and normal heart sounds.  Pulmonary/Chest: Effort normal and  breath sounds normal. She has no wheezes. She has no rales.  Lymphadenopathy:    She has no cervical adenopathy.  Neurological: She is alert and oriented to person, place, and time.  Skin: Skin is warm and dry.  Nursing note and vitals reviewed.    UC Treatments / Results  Labs (all labs ordered are listed, but only abnormal results are displayed) Labs Reviewed - No data to display  EKG  EKG Interpretation None       Radiology No results found.  Procedures Procedures (including critical care time)  Medications Ordered in UC Medications - No data to display   Initial Impression / Assessment and Plan / UC Course  I have reviewed the triage vital signs and the nursing notes.  Pertinent labs & imaging results that were available during my care of the patient were reviewed by me and considered in my medical decision making (see chart for details).      Final Clinical Impressions(s) / UC Diagnoses   Final diagnoses:  Acute recurrent maxillary sinusitis  Treatment options discussed, as well as risks, benefits, alternatives. Patient voiced understanding and agreement with the following plans:   ED Discharge Orders        Ordered    amoxicillin-clavulanate (AUGMENTIN) 875-125 MG tablet  Every 12 hours     02/06/18 1149    Flonase and other symptomatic care. Follow-up with your primary care doctor in 5-7 days if not improving, or sooner if symptoms become worse. Precautions discussed. Red flags discussed. Questions invited and answered. Patient voiced understanding and agreement.    Controlled Substance Prescriptions [place] Controlled Substance Registry consulted? Not Applicable   Jacqulyn Cane, MD 02/06/18 1200

## 2018-03-24 ENCOUNTER — Encounter: Payer: Self-pay | Admitting: Emergency Medicine

## 2018-03-24 ENCOUNTER — Emergency Department
Admission: EM | Admit: 2018-03-24 | Discharge: 2018-03-24 | Disposition: A | Discharge status: Home / Self Care | Payer: BC Managed Care – PPO | Source: Home / Self Care | Attending: Family Medicine | Admitting: Family Medicine

## 2018-03-24 DIAGNOSIS — J069 Acute upper respiratory infection, unspecified: Secondary | ICD-10-CM | POA: Diagnosis not present

## 2018-03-24 DIAGNOSIS — B9789 Other viral agents as the cause of diseases classified elsewhere: Secondary | ICD-10-CM

## 2018-03-24 DIAGNOSIS — M940 Chondrocostal junction syndrome [Tietze]: Secondary | ICD-10-CM

## 2018-03-24 MED ORDER — BENZONATATE 200 MG PO CAPS: ORAL_CAPSULE | ORAL | 0 refills | Status: DC

## 2018-03-24 MED ORDER — AMOXICILLIN 875 MG PO TABS: 875.0000 mg | ORAL_TABLET | Freq: Two times a day (BID) | ORAL | 0 refills | Status: DC

## 2018-03-24 MED ORDER — METHYLPREDNISOLONE ACETATE 80 MG/ML IJ SUSP
80.0000 mg | Freq: Once | INTRAMUSCULAR | Status: AC
  Administered 2018-03-24: 80 mg via INTRAMUSCULAR

## 2018-03-24 NOTE — ED Triage Notes (Signed)
Pt c/o cough and congestion x3 days. Afebrile.

## 2018-03-24 NOTE — Discharge Instructions (Signed)
Continue plain guaifenesin (1200mg  extended release tabs such as Mucinex) twice daily, with plenty of water, for cough and congestion.  Get adequate rest.   May use Afrin nasal spray (or generic oxymetazoline) each morning for about 5 days and then discontinue.  Also recommend using saline nasal spray several times daily and saline nasal irrigation (AYR is a common brand).  Use Flonase nasal spray each morning after using Afrin nasal spray and saline nasal irrigation. Try warm salt water gargles for sore throat.  Stop all antihistamines for now, and other non-prescription cough/cold preparations. Begin Amoxicillin if not improving about one week or if persistent fever develops

## 2018-03-24 NOTE — ED Provider Notes (Signed)
[name]    CSN: [id number] Arrival date & time: 03/24/18  0258     History   Chief Complaint Chief Complaint  Patient presents with  . Cough    HPI AMATULLAH CHRISTY is a 52 y.o. female.   Patient had several episodes of nausea/vomiting and chills/sweats three days ago that resolved.  Two days ago she developed low grade fever to 100+, myalgias, and post-nasal drainage.  Last night she developed a cough, hoarseness, and tightness in her anterior chest. She has a long history of URI's that tend to linger, and recurring sinusitis.  Her mother has asthma.  The history is provided by the patient.    Past Medical History:  Diagnosis Date  . Insomnia   . Tachycardia     There are no active problems to display for this patient.   Past Surgical History:  Procedure Laterality Date  . OOPHORECTOMY Right   . TONSILLECTOMY      OB History   None      Home Medications    Prior to Admission medications   Medication Sig Start Date End Date Taking? Authorizing Provider  lamoTRIgine (LAMICTAL) 150 MG tablet Take 150 mg by mouth daily.   Yes [provider]  amoxicillin (AMOXIL) 875 MG tablet Take 1 tablet (875 mg total) by mouth 2 (two) times daily. (Rx void after 04/01/18) 03/24/18   Kandra Nicolas, MD  benzonatate (TESSALON) 200 MG capsule Take one cap by mouth at bedtime as needed for cough.  May repeat in 4 to 6 hours 03/24/18   Kandra Nicolas, MD  clonazePAM (KLONOPIN) 1 MG tablet Take 1 mg by mouth 2 (two) times daily.    [provider]  Escitalopram Oxalate (LEXAPRO PO) Take by mouth.    [provider]  metoprolol tartrate (LOPRESSOR) 25 MG tablet Take by mouth 2 (two) times daily.    [provider]    Family History Family History  Problem Relation Age of Onset  . Cancer Mother        breast  . Lymphoma Father   . Alzheimer's disease Father     Social History Social History   Tobacco Use  . Smoking  status: Never Smoker  . Smokeless tobacco: Never Used  Substance Use Topics  . Alcohol use: Yes  . Drug use: No     Allergies   Patient has no known allergies.   Review of Systems Review of Systems ? sore throat + cough No pleuritic pain, but feels tight in anterior chest. No wheezing ? nasal congestion + post-nasal drainage No sinus pain/pressure No itchy/red eyes No earache No hemoptysis No SOB No fever, + chills + nausea, resolved No vomiting No abdominal pain No diarrhea No urinary symptoms No skin rash + fatigue + myalgias + headache Used OTC meds without relief   Physical Exam Triage Vital Signs ED Triage Vitals  Enc Vitals Group     BP 03/24/18 0859 111/79     Pulse Rate 03/24/18 0859 72     Resp --      Temp 03/24/18 0859 98.2 F (36.8 C)     Temp Source 03/24/18 0859 Oral     SpO2 03/24/18 0859 97 %     Weight 03/24/18 0900 165 lb (74.8 kg)     Height --      Head Circumference --      Peak Flow --      Pain Score 03/24/18 0900  0     Pain Loc --      Pain Edu? --      Excl. in [place]? --    No data found.  Updated Vital Signs BP 111/79 (BP Location: Right Arm)   Pulse 72   Temp 98.2 F (36.8 C) (Oral)   Wt 165 lb (74.8 kg)   SpO2 97%   BMI 28.32 kg/m   Visual Acuity Right Eye Distance:   Left Eye Distance:   Bilateral Distance:    Right Eye Near:   Left Eye Near:    Bilateral Near:     Physical Exam Nursing notes and Vital Signs reviewed. Appearance:  Patient appears stated age, and in no acute distress Eyes:  Pupils are equal, round, and reactive to light and accomodation.  Extraocular movement is intact.  Conjunctivae are not inflamed  Ears:  Canals normal.  Tympanic membranes normal.  Nose:  Mildly congested turbinates.  No sinus tenderness.    Pharynx:  Normal Neck:  Supple.  Enlarged posterior/lateral nodes are palpated bilaterally, tender to palpation on the left.   Lungs:  Clear to auscultation.  Breath sounds are  equal.  Moving air well. Chest:  Distinct tenderness to palpation over the mid-sternum.  Heart:  Regular rate and rhythm without murmurs, rubs, or gallops.  Abdomen:  Nontender without masses or hepatosplenomegaly.  Bowel sounds are present.  No CVA or flank tenderness.  Extremities:  No edema.  Skin:  No rash present.    UC Treatments / Results  Labs (all labs ordered are listed, but only abnormal results are displayed) Labs Reviewed - No data to display  EKG None Radiology No results found.  Procedures Procedures (including critical care time)  Medications Ordered in UC Medications  methylPREDNISolone acetate (DEPO-MEDROL) injection 80 mg (has no administration in time range)     Initial Impression / Assessment and Plan / UC Course  I have reviewed the triage vital signs and the nursing notes.  Pertinent labs & imaging results that were available during my care of the patient were reviewed by me and considered in my medical decision making (see chart for details).    There is no evidence of bacterial infection today.   Administered Depo Medrol 80mg  IM Prescription written for Benzonatate (Tessalon) to take at bedtime for night-time cough.  Continue plain guaifenesin (1200mg  extended release tabs such as Mucinex) twice daily, with plenty of water, for cough and congestion.  Get adequate rest.   May use Afrin nasal spray (or generic oxymetazoline) each morning for about 5 days and then discontinue.  Also recommend using saline nasal spray several times daily and saline nasal irrigation (AYR is a common brand).  Use Flonase nasal spray each morning after using Afrin nasal spray and saline nasal irrigation. Try warm salt water gargles for sore throat.  Stop all antihistamines for now, and other non-prescription cough/cold preparations. Begin Amoxicillin if not improving about one week or if persistent fever develops (Given a prescription to hold, with an expiration date)      Final Clinical Impressions(s) / UC Diagnoses   Final diagnoses:  Viral URI with cough  Costochondritis    ED Discharge Orders        Ordered    benzonatate (TESSALON) 200 MG capsule     03/24/18 0931    amoxicillin (AMOXIL) 875 MG tablet  2 times daily     03/24/18 0932           Theone Murdoch  A, MD 03/24/18 [id number]

## 2020-04-25 ENCOUNTER — Other Ambulatory Visit: Payer: Self-pay

## 2020-04-25 ENCOUNTER — Emergency Department
Admission: EM | Admit: 2020-04-25 | Discharge: 2020-04-25 | Disposition: A | Discharge status: Home / Self Care | Payer: BC Managed Care – PPO | Source: Home / Self Care | Attending: Family Medicine | Admitting: Family Medicine

## 2020-04-25 ENCOUNTER — Emergency Department (INDEPENDENT_AMBULATORY_CARE_PROVIDER_SITE_OTHER): Payer: BC Managed Care – PPO

## 2020-04-25 DIAGNOSIS — K581 Irritable bowel syndrome with constipation: Secondary | ICD-10-CM

## 2020-04-25 DIAGNOSIS — R109 Unspecified abdominal pain: Secondary | ICD-10-CM

## 2020-04-25 DIAGNOSIS — R1013 Epigastric pain: Secondary | ICD-10-CM

## 2020-04-25 LAB — POCT CBC W AUTO DIFF (K'VILLE URGENT CARE)

## 2020-04-25 LAB — POCT FASTING CBG KUC MANUAL ENTRY: POCT Glucose (KUC): 107 mg/dL — AB (ref 70–99)

## 2020-04-25 LAB — POCT URINALYSIS DIP (MANUAL ENTRY)
Bilirubin, UA: NEGATIVE
Blood, UA: NEGATIVE
Glucose, UA: NEGATIVE mg/dL
Ketones, POC UA: NEGATIVE mg/dL
Leukocytes, UA: NEGATIVE
Nitrite, UA: NEGATIVE
Protein Ur, POC: NEGATIVE mg/dL
Spec Grav, UA: 1.015 (ref 1.010–1.025)
Urobilinogen, UA: 0.2 E.U./dL
pH, UA: 5.5 (ref 5.0–8.0)

## 2020-04-25 NOTE — ED Triage Notes (Signed)
Patient presents to Urgent Care with complaints of intermittent nausea, diarrhea and vomiting since a week ago yesterday. Patient reports it started on mother's day. Pt states she has IBS, acid reflux, and heartburn and has an appt to see her GI specialist next week. Pt states she feels more fatigued than normal, has had both covid vaccines.

## 2020-04-25 NOTE — Discharge Instructions (Addendum)
Continue increased fluid intake.  May use Magnesium Citrate if desired.  If symptoms become significantly worse during the night or over the weekend, proceed to the local emergency room.

## 2020-04-25 NOTE — ED Provider Notes (Signed)
C[facility] C[facility]    CSN: [id number] Arrival date & time: 04/25/20  0848      History   Chief Complaint Chief Complaint  Patient presents with  . Emesis  . Diarrhea    HPI Catherine Fry is a 54 y.o. female.   About 8 days ago patient developed abdominal cramps, chills/sweats, fatigue, nausea/vomiting, and diarrhea.  Her diarrhea resolved but she has had persistent fatigue, nausea without vomiting, and mild diffuse abdominal pain/bloating.  She states that she has had two bowel movements this week.  She has IBS for which she takes Linzess 290mg  daily.  However, she states that she does not take it consistently because it often causes loose stools.  She states that she presently has increased abdominal pain/bloating immediately after eating.  She notes that her urine has been dark, without other urinary symptoms. 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 She reports that she has a follow-up appointment with her gastroenterologist next week.  The history is provided by the patient.    Past Medical History:  Diagnosis Date  . Insomnia   . Tachycardia     Current problems: Anxiety/depression Tachycardia   Past Surgical History:  Procedure Laterality Date  . OOPHORECTOMY Right   . TONSILLECTOMY      OB History   No obstetric history on file.      Home Medications    Prior to Admission medications   Medication Sig Start Date End Date Taking? Authorizing Provider  linaclotide (LINZESS) 290 MCG CAPS capsule Take 290 mcg by mouth daily before breakfast.   Yes [provider]  QUEtiapine (SEROQUEL) 400 MG tablet Take 400 mg by mouth at bedtime.   Yes [provider]  amoxicillin (AMOXIL) 875 MG tablet Take 1 tablet (875 mg total) by mouth 2 (two) times daily. (Rx void after 04/01/18) 03/24/18   Kandra Nicolas, MD  benzonatate (TESSALON) 200 MG capsule Take one cap by mouth at bedtime as needed  for cough.  May repeat in 4 to 6 hours 03/24/18   Kandra Nicolas, MD  clonazePAM (KLONOPIN) 1 MG tablet Take 1 mg by mouth 2 (two) times daily.    [provider]  Escitalopram Oxalate (LEXAPRO PO) Take by mouth.    [provider]  lamoTRIgine (LAMICTAL) 150 MG tablet Take 150 mg by mouth daily.    [provider]  metoprolol tartrate (LOPRESSOR) 25 MG tablet Take by mouth 2 (two) times daily.    [provider]    Family History Family History  Problem Relation Age of Onset  . Cancer Mother        breast  . Lymphoma Father   . Alzheimer's disease Father     Social History Social History   Tobacco Use  . Smoking status: Never Smoker  . Smokeless tobacco: Never Used  Substance Use Topics  . Alcohol use: Yes  . Drug use: No     Allergies   Patient has no known allergies.   Review of Systems Review of Systems  Constitutional: Positive for activity change, appetite change and fatigue. Negative for chills, diaphoresis and fever.  HENT: Negative.   Eyes: Negative.   Respiratory: Negative.   Cardiovascular: Negative.   Gastrointestinal: Positive for abdominal distention, abdominal pain, constipation and nausea. Negative for blood in stool and diarrhea.  Genitourinary: Negative.   Musculoskeletal: Negative.   Skin: Negative.   Neurological: Negative for  headaches.  Hematological: Negative for adenopathy.     Physical Exam Triage Vital Signs ED Triage Vitals  Enc Vitals Group     BP 04/25/20 0912 137/82     Pulse Rate 04/25/20 0912 66     Resp 04/25/20 0912 16     Temp 04/25/20 0912 98.2 F (36.8 C)     Temp Source 04/25/20 0912 Oral     SpO2 04/25/20 0912 100 %     Weight --      Height --      Head Circumference --      Peak Flow --      Pain Score 04/25/20 0907 0     Pain Loc --      Pain Edu? --      Excl. in [place]? --    No data found.  Updated Vital Signs BP 137/82 (BP Location: Right Arm)   Pulse 66   Temp 98.2  F (36.8 C) (Oral)   Resp 16   SpO2 100%   Visual Acuity Right Eye Distance:   Left Eye Distance:   Bilateral Distance:    Right Eye Near:   Left Eye Near:    Bilateral Near:     Physical Exam Vitals and nursing note reviewed.  Constitutional:      General: She is not in acute distress.    Appearance: She is not ill-appearing.  HENT:     Head: Normocephalic.     Right Ear: External ear normal.     Left Ear: External ear normal.     Nose: Nose normal.     Mouth/Throat:     Mouth: Mucous membranes are moist.     Pharynx: Oropharynx is clear.  Eyes:     Conjunctiva/sclera: Conjunctivae normal.     Pupils: Pupils are equal, round, and reactive to light.  Cardiovascular:     Rate and Rhythm: Normal rate and regular rhythm.     Heart sounds: Normal heart sounds.  Pulmonary:     Breath sounds: Normal breath sounds.  Abdominal:     General: Abdomen is flat. Bowel sounds are decreased.     Palpations: Abdomen is soft.     Tenderness: There is abdominal tenderness in the right lower quadrant and periumbilical area. There is no right CVA tenderness, left CVA tenderness, guarding or rebound. Negative signs include Murphy's sign.     Hernia: There is no hernia in the umbilical area.    Musculoskeletal:     Cervical back: Neck supple.     Right lower leg: No edema.     Left lower leg: No edema.  Lymphadenopathy:     Cervical: No cervical adenopathy.  Skin:    General: Skin is warm and dry.     Findings: No rash.  Neurological:     Mental Status: She is alert and oriented to person, place, and time.      UC Treatments / Results  Labs (all labs ordered are listed, but only abnormal results are displayed) Labs Reviewed  POCT FASTING [facility] - Abnormal; Notable for the following components:      Result Value   POCT Glucose ([facility]) 107 (*)    All other components within normal limits  COMPLETE METABOLIC PANEL WITH GFR  POCT CBC W AUTO DIFF ([facility]):  WBC 5.2; LY 37.4; MO 6.0;  GR 56.6; Hgb 13.1; Platelets 280   POCT URINALYSIS DIP (MANUAL ENTRY) negative    EKG  Radiology DG Abd 2 Views  Result Date: 04/25/2020 CLINICAL DATA:  Fever with nausea and vomiting EXAM: ABDOMEN - 2 VIEW COMPARISON:  Chest radiograph November 01, 2015 FINDINGS: Supine and upright images obtained. There is diffuse stool throughout much of the colon. There is no bowel dilatation or air-fluid level to suggest bowel obstruction. No free air. Lung bases are clear except for calcified granuloma in the lingular region. There are small calcifications in the spleen, likely splenic granulomas. Spleen appears rather prominent by radiography. IMPRESSION: Suggestion of splenic prominence with suspected splenic granulomas. Diffuse stool throughout colon, suggesting a degree of underlying constipation. No bowel obstruction or free air. No edema or consolidation in the lung bases. Electronically Signed   By: Lowella Grip III M.D.   On: 04/25/2020 10:49    Procedures Procedures (including critical care time)  Medications Ordered in UC Medications - No data to display  Initial Impression / Assessment and Plan / UC Course  I have reviewed the triage vital signs and the nursing notes.  Pertinent labs & imaging results that were available during my care of the patient were reviewed by me and considered in my medical decision making (see chart for details).    Normal CBC reassuring.  CMP pending. Suspect constipation source of patient's pain.  Patient prefers to try mag citrate for constipation rather than Miralax, which she states is not effective for her. Followup with GI next week as scheduled.   Final Clinical Impressions(s) / UC Diagnoses   Final diagnoses:  Epigastric pain  Abdominal pain  Irritable bowel syndrome with constipation     Discharge Instructions     Continue increased fluid intake.  May use Magnesium Citrate if desired.  If symptoms  become significantly worse during the night or over the weekend, proceed to the local emergency room.     ED Prescriptions    None        Kandra Nicolas, MD 04/28/20 1122

## 2020-04-26 LAB — COMPLETE METABOLIC PANEL WITH GFR
AG Ratio: 1.9 (calc) (ref 1.0–2.5)
ALT: 17 U/L (ref 6–29)
AST: 18 U/L (ref 10–35)
Albumin: 4 g/dL (ref 3.6–5.1)
Alkaline phosphatase (APISO): 62 U/L (ref 37–153)
BUN: 12 mg/dL (ref 7–25)
CO2: 25 mmol/L (ref 20–32)
Calcium: 9.6 mg/dL (ref 8.6–10.4)
Chloride: 106 mmol/L (ref 98–110)
Creat: 0.99 mg/dL (ref 0.50–1.05)
GFR, Est African American: 74 mL/min/{1.73_m2} (ref >=60)
GFR, Est Non African American: 64 mL/min/{1.73_m2} (ref >=60)
Globulin: 2.1 g/dL (calc) (ref 1.9–3.7)
Glucose, Bld: 90 mg/dL (ref 65–99)
Potassium: 3.8 mmol/L (ref 3.5–5.3)
Sodium: 139 mmol/L (ref 135–146)
Total Bilirubin: 0.4 mg/dL (ref 0.2–1.2)
Total Protein: 6.1 g/dL (ref 6.1–8.1)

## 2020-05-30 ENCOUNTER — Ambulatory Visit: Payer: BC Managed Care – PPO | Admitting: Physician Assistant

## 2020-09-26 ENCOUNTER — Ambulatory Visit: Payer: BC Managed Care – PPO | Admitting: Dermatology

## 2020-10-19 ENCOUNTER — Ambulatory Visit: Payer: BC Managed Care – PPO | Admitting: Physician Assistant

## 2020-12-20 ENCOUNTER — Ambulatory Visit: Payer: BC Managed Care – PPO | Admitting: Dermatology

## 2020-12-20 ENCOUNTER — Encounter: Payer: Self-pay | Admitting: Dermatology

## 2020-12-20 ENCOUNTER — Other Ambulatory Visit: Payer: Self-pay

## 2020-12-20 DIAGNOSIS — Z86018 Personal history of other benign neoplasm: Secondary | ICD-10-CM

## 2020-12-20 DIAGNOSIS — Z1283 Encounter for screening for malignant neoplasm of skin: Secondary | ICD-10-CM

## 2020-12-20 DIAGNOSIS — L821 Other seborrheic keratosis: Secondary | ICD-10-CM

## 2020-12-20 DIAGNOSIS — D1800 Hemangioma unspecified site: Secondary | ICD-10-CM

## 2020-12-20 DIAGNOSIS — L570 Actinic keratosis: Secondary | ICD-10-CM | POA: Diagnosis not present

## 2020-12-20 DIAGNOSIS — Z87898 Personal history of other specified conditions: Secondary | ICD-10-CM

## 2020-12-20 MED ORDER — TOLAK 4 % EX CREA: 1.0000 "application " | TOPICAL_CREAM | Freq: Every evening | CUTANEOUS | 1 refills | Status: DC

## 2020-12-23 ENCOUNTER — Encounter: Payer: Self-pay | Admitting: Dermatology

## 2020-12-23 NOTE — Progress Notes (Signed)
   Follow-Up Visit   Subjective  Catherine Fry is a 55 y.o. female who presents for the following: Annual Exam.  General skin check Location: Crusts on face Duration:  Quality:  Associated Signs/Symptoms: Modifying Factors:  Severity:  Timing: Context:   Objective  Well appearing patient in no apparent distress; mood and affect are within normal limits. Objective  Right Upper Buttock: White scar- clear  Objective  Chest - Medial [employer]): Full body skin examination: No atypical moles, melanoma, none mole skin cancer  Objective  Right Malar Cheek: Pink hornlike 5 mm crust plus multiple 1 to 2 mm spots  Objective  Right Upper Back: Stuck-on, waxy, tan-brown papules and plaques. --Discussed benign etiology and prognosis.   Objective  Right Abdomen (side) - Upper: Multiple red raised papule    A full examination was performed including scalp, head, eyes, ears, nose, lips, neck, chest, axillae, abdomen, back, buttocks, bilateral upper extremities, bilateral lower extremities, hands, feet, fingers, toes, fingernails, and toenails. All findings within normal limits unless otherwise noted below.   Assessment & Plan    History of atypical nevus Right Upper Buttock  Yearly skin check  Screening exam for skin cancer Chest - Medial (Center)  Yearly skin check  AK (actinic keratosis) Right Malar Cheek  [place] 4 % Cream: Targeted for total of 28 applications.  If daily application produces too much irritation in 2 weeks, take 5 days of an resume by applying it every other night.  Call with any issues.  Strictly sun protect.  Ordered Medications: Fluorouracil (TOLAK) 4 % CREA  Seborrheic keratosis Right Upper Back  Okay to leave if stable  Hemangioma, unspecified site Right Abdomen (side) - Upper  Okay to leave if stable      I, Lavonna Monarch, MD, have reviewed all documentation for this visit.  The documentation on 12/23/20 for the exam, diagnosis,  procedures, and orders are all accurate and complete.

## 2021-03-31 ENCOUNTER — Other Ambulatory Visit: Payer: Self-pay | Admitting: Family Medicine

## 2021-03-31 DIAGNOSIS — Q8909 Congenital malformations of spleen: Secondary | ICD-10-CM

## 2021-04-24 ENCOUNTER — Encounter: Payer: Self-pay | Admitting: Emergency Medicine

## 2021-04-24 ENCOUNTER — Other Ambulatory Visit: Payer: Self-pay

## 2021-04-24 ENCOUNTER — Emergency Department
Admission: EM | Admit: 2021-04-24 | Discharge: 2021-04-24 | Disposition: A | Discharge status: Home / Self Care | Payer: BC Managed Care – PPO | Source: Home / Self Care | Attending: Family Medicine | Admitting: Family Medicine

## 2021-04-24 DIAGNOSIS — L710 Perioral dermatitis: Secondary | ICD-10-CM

## 2021-04-24 MED ORDER — METRONIDAZOLE 0.75 % EX CREA: TOPICAL_CREAM | CUTANEOUS | 1 refills | Status: DC

## 2021-04-24 MED ORDER — TETRACYCLINE HCL 250 MG PO CAPS: 250.0000 mg | ORAL_CAPSULE | Freq: Two times a day (BID) | ORAL | 0 refills | Status: DC

## 2021-04-24 NOTE — ED Triage Notes (Addendum)
Rash on face x 3-4 months, itches intermittently

## 2021-04-24 NOTE — ED Provider Notes (Signed)
C[facility] Fry [facility]    CSN: [id number] Arrival date & time: 04/24/21  1258      History   Chief Complaint Chief Complaint  Patient presents with  . Rash    HPI Catherine Fry is a 55 y.o. female.   Patient complains of a mildly pruritic rash on her face for about 3 months.  The rash is generally on her lower cheeks, beneath nose, and around her mouth.  She states that she has had acne in the past, but this is different.  She denies use of corticosteroid preparations.  The history is provided by the patient.  Rash Location:  Face Quality: dryness, itchiness and redness   Quality: not blistering, not burning, not draining, not painful, not peeling, not scaling, not swelling and not weeping   Severity:  Mild Onset quality:  Gradual Duration:  3 months Timing:  Constant Progression:  Unchanged Chronicity:  Chronic Context: not animal contact, not chemical exposure, not exposure to similar rash, not food, not insect bite/sting, not medications, not new detergent/soap, not nuts, not plant contact and not sun exposure   Relieved by:  None tried Worsened by:  Nothing Ineffective treatments:  None tried Associated symptoms: no fatigue, no fever, no induration and no periorbital edema     Past Medical History:  Diagnosis Date  . Atypical mole    moderte right upper buttock  . Insomnia   . Tachycardia     There are no problems to display for this patient.   Past Surgical History:  Procedure Laterality Date  . OOPHORECTOMY Right   . TONSILLECTOMY      OB History   No obstetric history on file.      Home Medications    Prior to Admission medications   Medication Sig Start Date End Date Taking? Authorizing Provider  meloxicam (MOBIC) 15 MG tablet Take 15 mg by mouth daily.   Yes [provider]  metroNIDAZOLE (METROCREAM) 0.75 % cream Apply thin film to affected areas of face once daily. 04/24/21  Yes Kandra Nicolas, MD  tetracycline (SUMYCIN) 250  MG capsule Take 1 capsule (250 mg total) by mouth 2 (two) times daily before a meal. 04/24/21  Yes Trai Ells, Ishmael Holter, MD  clonazePAM (KLONOPIN) 1 MG tablet Take 1 mg by mouth 2 (two) times daily.    [provider]  gabapentin (NEURONTIN) 100 MG capsule Take 100 mg by mouth 3 (three) times daily. 12/02/20   [provider]  lamoTRIgine (LAMICTAL) 150 MG tablet Take 150 mg by mouth daily.    [provider]  linaclotide (LINZESS) 290 MCG CAPS capsule Take 290 mcg by mouth daily before breakfast.    [provider]  metoprolol tartrate (LOPRESSOR) 25 MG tablet Take by mouth 2 (two) times daily.    [provider]  pantoprazole (PROTONIX) 40 MG tablet Take 40 mg by mouth daily. 10/18/20   [provider]  QUEtiapine (SEROQUEL) 400 MG tablet Take 400 mg by mouth at bedtime.    [provider]    Family History Family History  Problem Relation Age of Onset  . Cancer Mother        breast  . Lymphoma Father   . Alzheimer's disease Father     Social History Social History   Tobacco Use  . Smoking status: Never Smoker  . Smokeless tobacco: Never Used  Vaping Use  . Vaping Use: Never used  Substance Use Topics  . Alcohol use: Yes  .  Drug use: No     Allergies   Patient has no known allergies.   Review of Systems Review of Systems  Constitutional: Negative for chills, diaphoresis, fatigue and fever.  Skin: Positive for rash.  All other systems reviewed and are negative.    Physical Exam Triage Vital Signs ED Triage Vitals  Enc Vitals Group     BP 04/24/21 1317 130/88     Pulse Rate 04/24/21 1317 74     Resp 04/24/21 1317 18     Temp 04/24/21 1317 98.5 F (36.9 C)     Temp Source 04/24/21 1317 Oral     SpO2 04/24/21 1317 98 %     Weight 04/24/21 1318 178 lb (80.7 kg)     Height 04/24/21 1318 5\' 4"  (1.626 m)     Head Circumference --      Peak Flow --      Pain Score 04/24/21 1318 0     Pain Loc --      Pain  Edu? --      Excl. in [place]? --    No data found.  Updated Vital Signs BP 130/88 (BP Location: Right Arm)   Pulse 74   Temp 98.5 F (36.9 C) (Oral)   Resp 18   Ht 5\' 4"  (1.626 m)   Wt 80.7 kg   SpO2 98%   BMI 30.55 kg/m   Visual Acuity Right Eye Distance:   Left Eye Distance:   Bilateral Distance:    Right Eye Near:   Left Eye Near:    Bilateral Near:     Physical Exam Vitals and nursing note reviewed.  Constitutional:      General: She is not in acute distress. HENT:     Head: Normocephalic.      Comments: On the face there are multiple small inflammatory papules clustered on the perioral, perinasal, and periocular skin.  Beneath the right nares there is a small papulopustule present.         Right Ear: External ear normal.     Left Ear: External ear normal.     Nose: Nose normal.     Mouth/Throat:     Pharynx: Oropharynx is clear.  Eyes:     Conjunctiva/sclera: Conjunctivae normal.     Pupils: Pupils are equal, round, and reactive to light.  Cardiovascular:     Rate and Rhythm: Normal rate.  Pulmonary:     Effort: Pulmonary effort is normal.  Musculoskeletal:     Cervical back: Neck supple.  Lymphadenopathy:     Cervical: No cervical adenopathy.  Skin:    General: Skin is warm and dry.  Neurological:     Mental Status: She is alert and oriented to person, place, and time.      UC Treatments / Results  Labs (all labs ordered are listed, but only abnormal results are displayed) Labs Reviewed - No data to display  EKG   Radiology No results found.  Procedures Procedures (including critical care time)  Medications Ordered in UC Medications - No data to display  Initial Impression / Assessment and Plan / UC Course  I have reviewed the triage vital signs and the nursing notes.  Pertinent labs & imaging results that were available during my care of the patient were reviewed by me and considered in my medical decision making (see chart for  details).    Begin tetracycline 250mg  BID for five days. Begin metronidazole cream 0.75% once daily. Followup with dermatologist in  about one month.   Final Clinical Impressions(s) / UC Diagnoses   Final diagnoses:  Perioral dermatitis     Discharge Instructions     Avoid applying any steroid creams such as 1% hydrocortisone ("Cortaid" etc)   ED Prescriptions    Medication Sig Dispense Auth. Provider   tetracycline (SUMYCIN) 250 MG capsule Take 1 capsule (250 mg total) by mouth 2 (two) times daily before a meal. 10 capsule Kandra Nicolas, MD   metroNIDAZOLE (METROCREAM) 0.75 % cream Apply thin film to affected areas of face once daily. 45 g Kandra Nicolas, MD        Kandra Nicolas, MD 04/27/21 [phone]

## 2021-04-24 NOTE — Discharge Instructions (Signed)
Avoid applying any steroid creams such as 1% hydrocortisone ("Cortaid" etc)

## 2021-05-15 ENCOUNTER — Other Ambulatory Visit: Payer: Self-pay

## 2021-05-15 ENCOUNTER — Ambulatory Visit: Payer: Medicare Other | Admitting: Dermatology

## 2021-05-15 ENCOUNTER — Encounter: Payer: Self-pay | Admitting: Dermatology

## 2021-05-15 DIAGNOSIS — L814 Other melanin hyperpigmentation: Secondary | ICD-10-CM

## 2021-05-15 DIAGNOSIS — L710 Perioral dermatitis: Secondary | ICD-10-CM | POA: Diagnosis not present

## 2021-05-15 MED ORDER — AMZEEQ 4 % EX FOAM: 1.0000 "application " | Freq: Every day | CUTANEOUS | 2 refills | Status: DC

## 2021-05-15 NOTE — Patient Instructions (Signed)
[employer] [phone]

## 2021-05-19 ENCOUNTER — Encounter: Payer: Self-pay | Admitting: Dermatology

## 2021-05-19 NOTE — Progress Notes (Signed)
   Follow-Up Visit   Subjective  Catherine Fry is a 55 y.o. female who presents for the following: Skin Problem (Patient here today for itching, redness, and acne like blisters around nose and on chin. Per patient she went to the Urgent Care on 04/24/2021 and was diagnosed with Perioral dermatitis, patient is using Metronidazole some improvement. ).  Facial rash Location:  Duration:  Quality:  Associated Signs/Symptoms: Modifying Factors:  Severity:  Timing: Context:   Objective  Well appearing patient in no apparent distress; mood and affect are within normal limits. Left Upper Cutaneous Lip, Mid Chin, Right Upper Cutaneous Lip In addition to trying topical metronidazole patient was given approximately a 5-day course of oral tetracycline with perhaps some improvement.  Today there are roughly 10 slightly edematous pink 2 to 4 mm papules in a perinasal and perioral distribution with sparing of the vermilion border.  No mucosal involvement.  Although clinically this would fit perioral dermatitis, I explained to Catherine Fry that this remains a diagnosis of exclusion with uncertain etiology.  Left Medial Plantar Surface Four millimeter monochrome slightly elongated brown macule; dermoscopy supports benign acral lentigo    A focused examination was performed including head, neck, feet. Relevant physical exam findings are noted in the Assessment and Plan.   Assessment & Plan    Perioral dermatitis Left Upper Cutaneous Lip; Right Upper Cutaneous Lip; Mid Chin  If the out-of-pocket expense is reasonable we will try topical Amzeeq nightly for 4 to 6 weeks; follow-up by telephone at that time.  Related Medications Minocycline HCl Micronized (AMZEEQ) 4 % FOAM Apply 1 application topically at bedtime.  Lentigo Left Medial Plantar Surface  Leave if stable      I, Lavonna Monarch, MD, have reviewed all documentation for this visit.  The documentation on 05/19/21 for the exam,  diagnosis, procedures, and orders are all accurate and complete.

## 2021-06-07 ENCOUNTER — Telehealth: Payer: Self-pay | Admitting: *Deleted

## 2021-06-07 NOTE — Telephone Encounter (Signed)
Per referral Dr. Rolland Porter - called and lvm of upcoming appointments - requested callback to confirm - mailed welcome packet with calendar

## 2021-06-14 IMAGING — DX DG ABDOMEN 2V
3 series · 3 of 3 positions shown · non-contrast
Comparison: Chest radiograph November 01, 2015

CLINICAL DATA: Fever with nausea and vomiting

EXAM:
ABDOMEN - 2 VIEW

[abdomen erect]
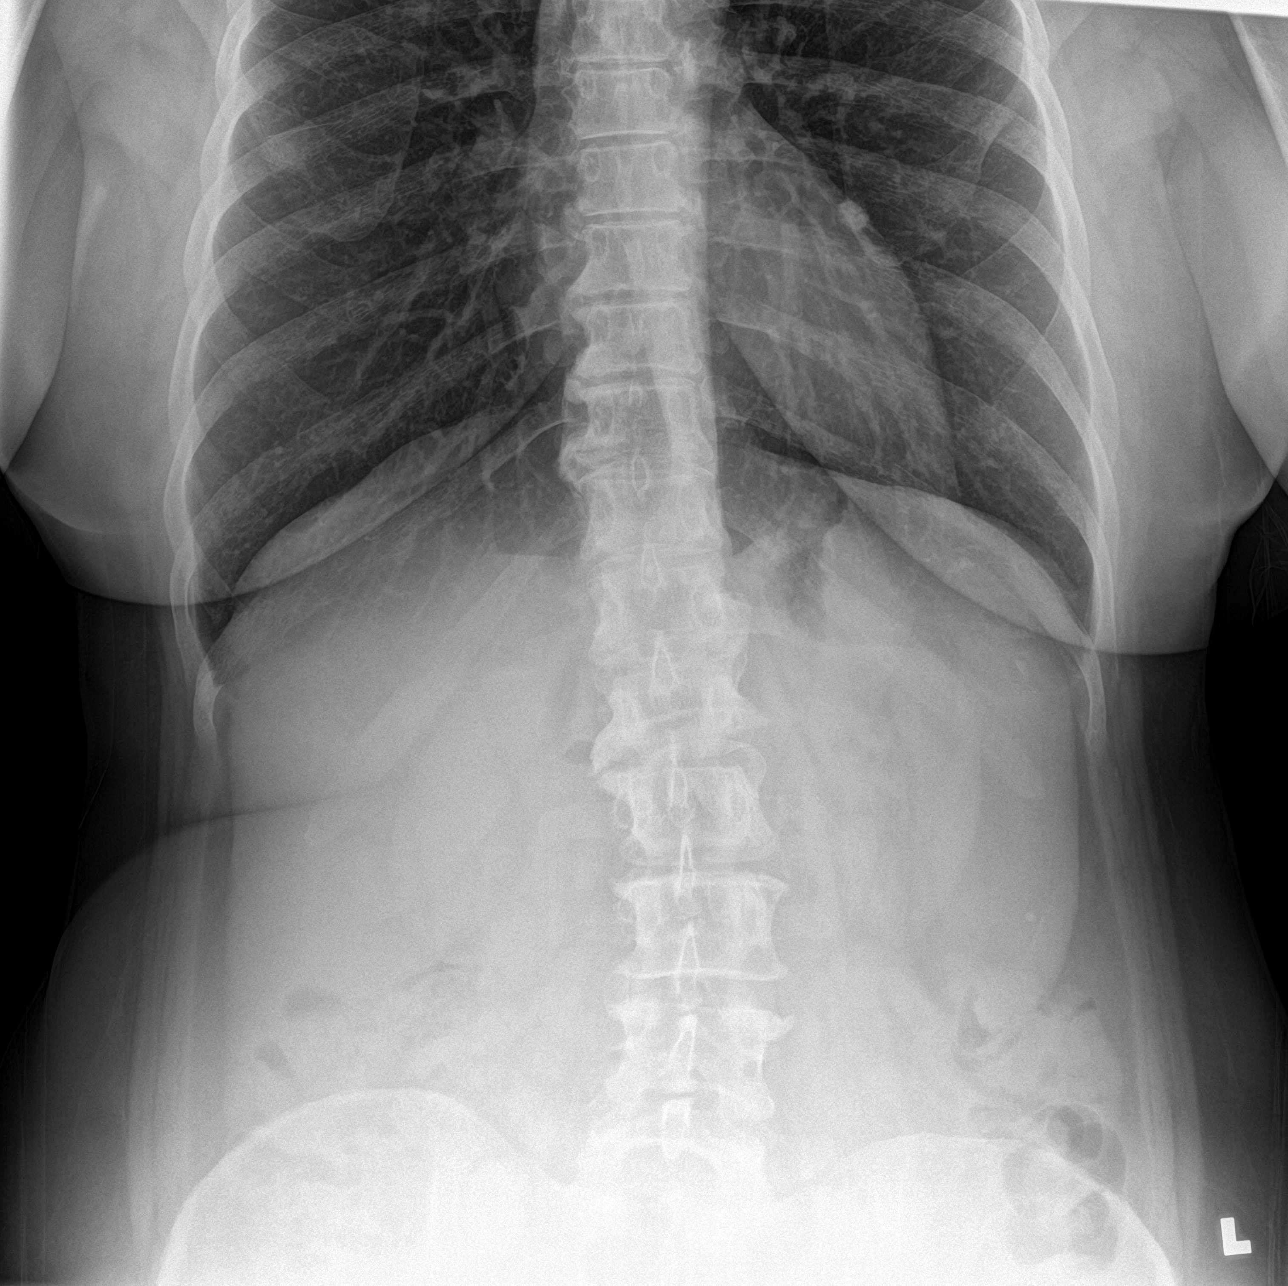

[abdomen supine (1 of 2)]
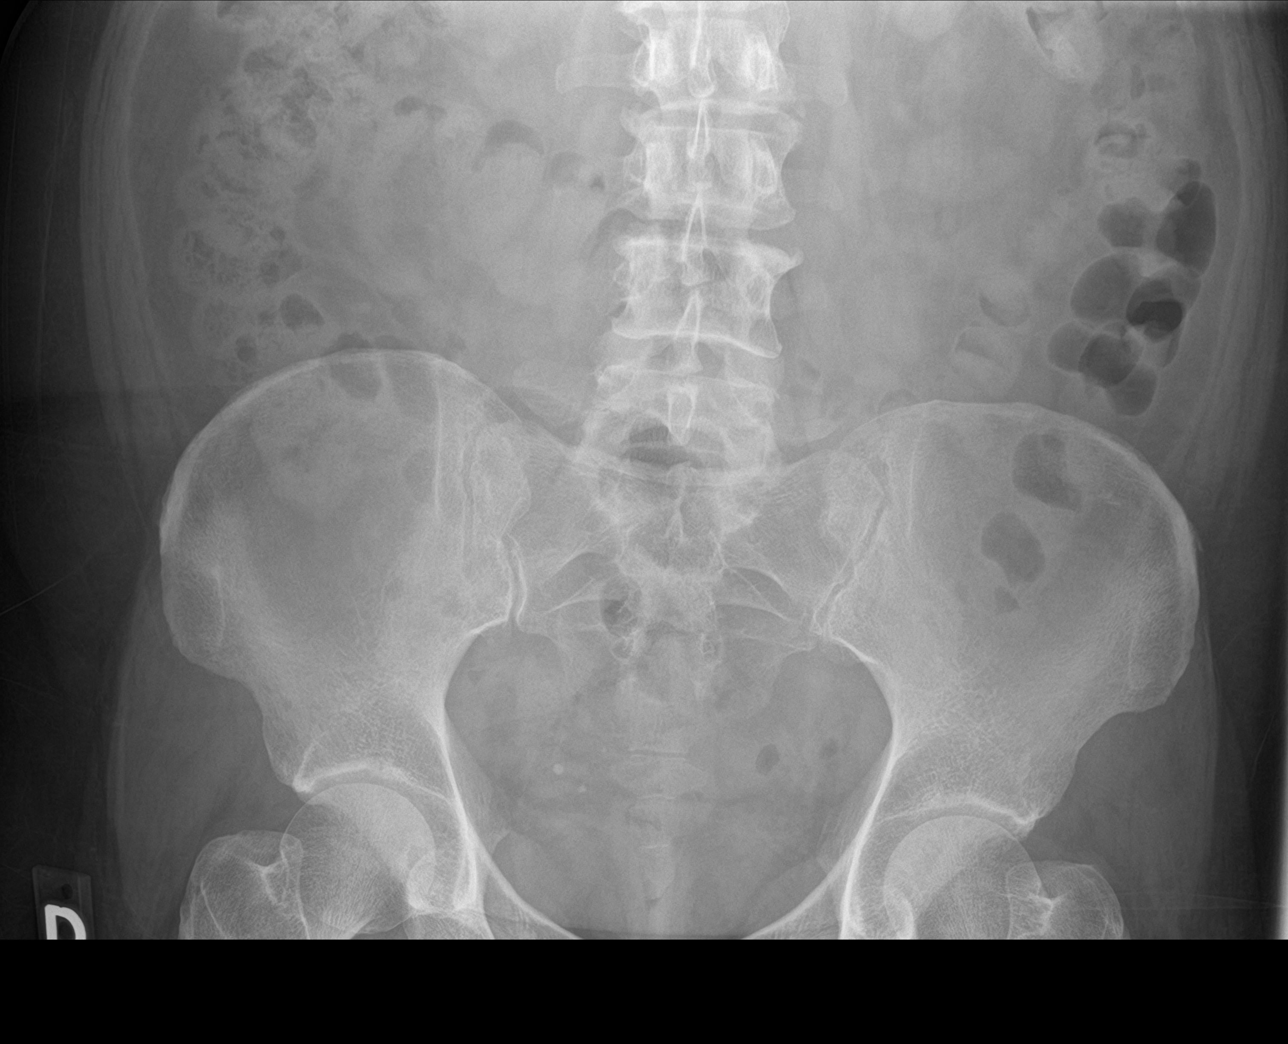

[abdomen supine (2 of 2)]
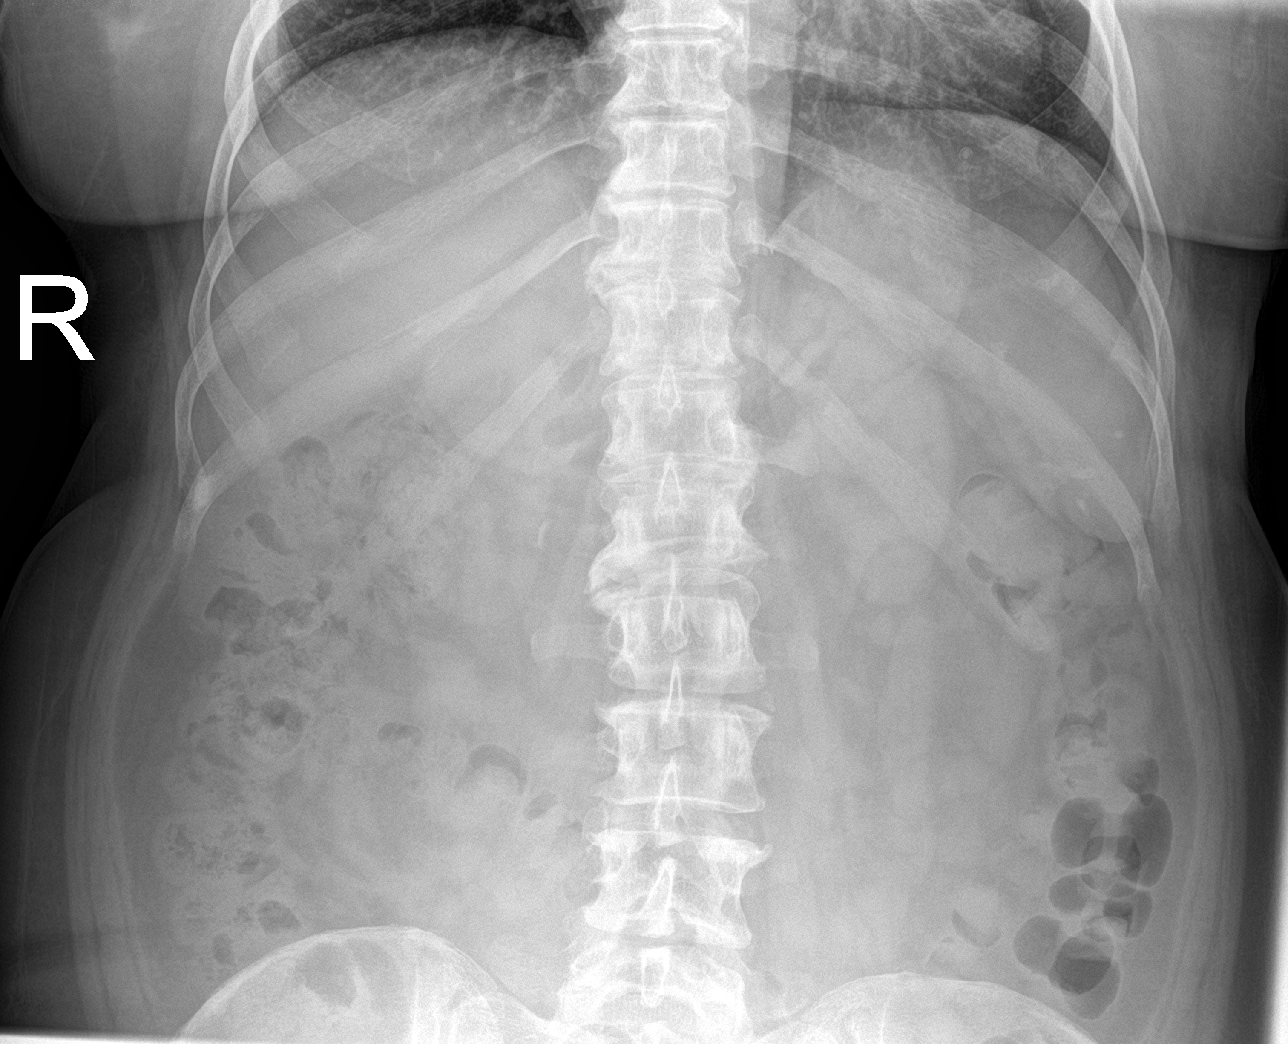

[3 of 3 positions shown; findings below may reference images not displayed]

FINDINGS: Supine and upright images obtained. There is diffuse stool
throughout much of the colon. There is no bowel dilatation or
air-fluid level to suggest bowel obstruction. No free air. Lung
bases are clear except for calcified granuloma in the lingular
region. There are small calcifications in the spleen, likely splenic
granulomas. Spleen appears rather prominent by radiography.
IMPRESSION: Suggestion of splenic prominence with suspected splenic granulomas.

Diffuse stool throughout colon, suggesting a degree of underlying
constipation. No bowel obstruction or free air. No edema or
consolidation in the lung bases.

## 2021-06-19 ENCOUNTER — Other Ambulatory Visit: Payer: Self-pay

## 2021-06-19 ENCOUNTER — Inpatient Hospital Stay: Payer: Medicare Other | Attending: Hematology & Oncology

## 2021-06-19 ENCOUNTER — Encounter: Payer: Self-pay | Admitting: Hematology & Oncology

## 2021-06-19 ENCOUNTER — Inpatient Hospital Stay: Payer: Medicare Other | Admitting: Hematology & Oncology

## 2021-06-19 VITALS — BP 133/81 | HR 75 | Temp 99.0°F | Resp 20 | Ht 64.0 in | Wt 179.4 lb

## 2021-06-19 DIAGNOSIS — R161 Splenomegaly, not elsewhere classified: Secondary | ICD-10-CM

## 2021-06-19 DIAGNOSIS — Z807 Family history of other malignant neoplasms of lymphoid, hematopoietic and related tissues: Secondary | ICD-10-CM | POA: Insufficient documentation

## 2021-06-19 DIAGNOSIS — Z803 Family history of malignant neoplasm of breast: Secondary | ICD-10-CM | POA: Diagnosis not present

## 2021-06-19 LAB — CBC WITH DIFFERENTIAL (CANCER CENTER ONLY)
Abs Immature Granulocytes: 0.03 10*3/uL (ref 0.00–0.07)
Basophils Absolute: 0.1 10*3/uL (ref 0.0–0.1)
Basophils Relative: 1 %
Eosinophils Absolute: 0.1 10*3/uL (ref 0.0–0.5)
Eosinophils Relative: 2 %
HCT: 39 % (ref 36.0–46.0)
Hemoglobin: 13.1 g/dL (ref 12.0–15.0)
Immature Granulocytes: 0 %
Lymphocytes Relative: 23 %
Lymphs Abs: 1.5 10*3/uL (ref 0.7–4.0)
MCH: 29.6 pg (ref 26.0–34.0)
MCHC: 33.6 g/dL (ref 30.0–36.0)
MCV: 88.2 fL (ref 80.0–100.0)
Monocytes Absolute: 0.4 10*3/uL (ref 0.1–1.0)
Monocytes Relative: 6 %
Neutro Abs: 4.5 10*3/uL (ref 1.7–7.7)
Neutrophils Relative %: 68 %
Platelet Count: 284 10*3/uL (ref 150–400)
RBC: 4.42 MIL/uL (ref 3.87–5.11)
RDW: 12.9 % (ref 11.5–15.5)
WBC Count: 6.7 10*3/uL (ref 4.0–10.5)
nRBC: 0 % (ref 0.0–0.2)

## 2021-06-19 LAB — CMP (CANCER CENTER ONLY)
ALT: 14 U/L (ref 0–44)
AST: 18 U/L (ref 15–41)
Albumin: 4.6 g/dL (ref 3.5–5.0)
Alkaline Phosphatase: 75 U/L (ref 38–126)
Anion gap: 6 (ref 5–15)
BUN: 18 mg/dL (ref 6–20)
CO2: 26 mmol/L (ref 22–32)
Calcium: 10.5 mg/dL — ABNORMAL HIGH (ref 8.9–10.3)
Chloride: 104 mmol/L (ref 98–111)
Creatinine: 1.15 mg/dL — ABNORMAL HIGH (ref 0.44–1.00)
GFR, Estimated: 56 mL/min — ABNORMAL LOW (ref >60)
Glucose, Bld: 107 mg/dL — ABNORMAL HIGH (ref 70–99)
Potassium: 4.1 mmol/L (ref 3.5–5.1)
Sodium: 136 mmol/L (ref 135–145)
Total Bilirubin: 0.4 mg/dL (ref 0.3–1.2)
Total Protein: 6.7 g/dL (ref 6.5–8.1)

## 2021-06-19 LAB — LACTATE DEHYDROGENASE: LDH: 187 U/L (ref 98–192)

## 2021-06-19 LAB — SAVE SMEAR(SSMR), FOR PROVIDER SLIDE REVIEW

## 2021-06-19 NOTE — Progress Notes (Signed)
Referral MD  Reason for Referral: Splenomegaly-  ?  Idiopathic  Chief Complaint  Patient presents with   New Patient (Initial Visit)    Enlarged spleen.  : My spleen is big.  HPI: Ms. Catherine Fry is a very charming 55 year old white female.  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  She has irritable bowel syndrome.  She is on Linzess for this.  She apparently had a ultrasound done a year ago.  This was for some abdominal discomfort.  The pain seemed to more so on the right side than left side.  However, it appeared to show some enlarged splenic tissue.  I am unsure there were any kind of splenic granulomas.  She has had no fever.  There is no weight loss.  She has not noted any palpable lymph nodes.  She has had no obvious change in bowel or bladder habits.  She is up-to-date with her mammogram and colonoscopy..  There is a history of Lymphoma and a Sister.  Her sister is a Animal nutritionist.  She apparently had chemotherapy for all this.  She is fine right now.  There is a lengthy history of cancer in the family.  She does not smoke.  I do not think that she has any significant alcoholic beverage use.  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  She apparently had a ultrasound of the abdomen done 3 weeks ago.  I saw the report on her cell phone.  Said that there is splenomegaly with the spleen measuring 14.5 cm.  The liver looked fine.  There is no coarse architecture that would suggest cirrhosis or hepatic steatosis.  There is no obvious adenopathy.  She subsequently was kindly referred to the [facility] for an evaluation.  She has had no rashes.  There is been no bleeding.  I must say that she is delightful to talk with.  She was incredibly perky.  She was initially nervous but we allayed her anxiety.  Currently, I would have to say that her performance status is ECOG 0.    Past Medical History:   Diagnosis Date   Atypical mole    moderte right upper buttock   Insomnia    Tachycardia   :   Past Surgical History:  Procedure Laterality Date   OOPHORECTOMY Right    TONSILLECTOMY    :   Current Outpatient Medications:    ALPRAZolam (XANAX) 1 MG tablet, Take 1 mg by mouth daily as needed., Disp: , Rfl:    clonazePAM (KLONOPIN) 1 MG tablet, Take 2 mg by mouth at bedtime., Disp: , Rfl:    gabapentin (NEURONTIN) 100 MG capsule, Take 300 mg by mouth at bedtime., Disp: , Rfl:    lamoTRIgine (LAMICTAL) 200 MG tablet, Take 200 mg by mouth daily., Disp: , Rfl:    linaclotide (LINZESS) 290 MCG CAPS capsule, Take 290 mcg by mouth daily before breakfast., Disp: , Rfl:    melatonin 5 MG TABS, 2 gummies, Disp: , Rfl:    metoprolol succinate (TOPROL-XL) 25 MG 24 hr tablet, Take 1 tablet by mouth daily., Disp: , Rfl:    Minocycline HCl Micronized (AMZEEQ) 4 % FOAM, Apply 1 application topically at bedtime., Disp: 30 g, Rfl: 2   pantoprazole (PROTONIX) 40 MG tablet, Take 40 mg by mouth daily., Disp: , Rfl:    QUEtiapine (SEROQUEL) 400 MG tablet, Take 400 mg by mouth at bedtime.,  Disp: , Rfl:    meloxicam (MOBIC) 15 MG tablet, Take 15 mg by mouth daily as needed. (Patient not taking: Reported on 06/19/2021), Disp: , Rfl: :  :  No Known Allergies:   Family History  Problem Relation Age of Onset   Cancer Mother        breast   Lymphoma Father    Alzheimer's disease Father   :   Social History   Socioeconomic History   Marital status: Married    Spouse name: Not on file   Number of children: Not on file   Years of education: Not on file   Highest education level: Not on file  Occupational History   Not on file  Tobacco Use   Smoking status: Never   Smokeless tobacco: Never  Vaping Use   Vaping Use: Never used  Substance and Sexual Activity   Alcohol use: Yes   Drug use: No   Sexual activity: Not on file  Other Topics Concern   Not on file  Social History Narrative   Not  on file   Social Determinants of Health   Financial Resource Strain: Not on file  Food Insecurity: Not on file  Transportation Needs: Not on file  Physical Activity: Not on file  Stress: Not on file  Social Connections: Not on file  Intimate Partner Violence: Not on file  :  Review of Systems  Constitutional: Negative.   HENT: Negative.    Eyes: Negative.   Respiratory: Negative.    Cardiovascular: Negative.   Gastrointestinal:  Positive for abdominal pain.  Genitourinary: Negative.   Musculoskeletal: Negative.   Skin: Negative.   Neurological: Negative.   Endo/Heme/Allergies: Negative.   Psychiatric/Behavioral: Negative.      Exam:   This is a well-developed well-nourished white female in no obvious distress.  Vital signs show temperature of 99.  Pulse 75.  Blood pressure 133/81.  Weight is 179 pounds.  Head and neck exam shows no ocular and oral lesions.  There are no palpable cervical or supraclavicular lymph nodes.  Lungs are clear bilaterally.  Cardiac exam regular rate and rhythm with no murmurs, rubs or bruits.  Abdomen is soft.  She has good bowel sounds.  There is no fluid wave.  There is no guarding or rebound tenderness.  Her spleen tip might be palpable with deep inspiration.  There is no hepatomegaly.  Back exam shows no tenderness over the spine, ribs or hips.  Extremity shows no clubbing, cyanosis or edema.  Skin exam shows no rashes, ecchymoses or petechia.  Neurological exam shows no focal neurological deficits. '@IPVITALS' @    Recent Labs    06/19/21 1332  WBC 6.7  HGB 13.1  HCT 39.0  PLT 284    Recent Labs    06/19/21 1332  NA 136  K 4.1  CL 104  CO2 26  GLUCOSE 107*  BUN 18  CREATININE 1.15*  CALCIUM 10.5*    Blood smear review: This is normochromic and normocytic population of red blood cells.  There is no anisocytosis or poikilocytosis.  There is no rouleaux formation.  I see no nucleated red blood cells.  She has no schistocytes.  There  is no target cells.  White blood cells appear normal in morphology and maturation.  I do not see any immature myeloid or lymphoid forms.  There is no hypersegmented polys.  Platelets are adequate number and size.  Pathology: None    Assessment and Plan: Ms. Fosdick is a  very charming 55 year old white female with splenomegaly.  This is mild splenomegaly.  Clearly, her blood counts have not been affected.  Her blood smear is absolutely benign.  I cannot find anything on her physical exam although the spleen tip might be palpable with inspiration.  I cannot feel any obvious hematologic malignancy that would be causing any splenomegaly.  I suppose she could always have splenic lymphoma but this would really involve taking her spleen out or doing a bone marrow biopsy.  I do not recommend either of these.  I reassured her that I just did not think we had a problem right now.  Again her spleen is not that enlarged.  Is not affecting her blood counts.  It is not cause any hypermetabolism.  She is not losing weight.  She not having fevers.  I think what we could best to is just follow this along.  I would do another ultrasound of her spleen in about 3 months.  I think this would be very reasonable.  I would recheck her blood counts.  Again, I reassured her that I just do not think we had a problem right now.  This might be idiopathic.  She could have had splenomegaly for quite a while and just never had known it.  I spent a good hour with her.  She is very nice.  She is very engaging.

## 2021-06-20 ENCOUNTER — Telehealth: Payer: Self-pay

## 2021-06-20 LAB — BETA 2 MICROGLOBULIN, SERUM: Beta-2 Microglobulin: 1.9 mg/L (ref 0.6–2.4)

## 2021-07-03 ENCOUNTER — Telehealth: Payer: Self-pay | Admitting: Dermatology

## 2021-07-03 MED ORDER — MINOCYCLINE HCL 50 MG PO CAPS: 50.0000 mg | ORAL_CAPSULE | Freq: Two times a day (BID) | ORAL | 1 refills | Status: DC

## 2021-07-03 NOTE — Telephone Encounter (Signed)
Phone call from patient saying after using the foam for 6-8 wks it's not really helped her face. Pt said Dr Denna Haggard mentioned if the foam didn't work he may try an oral antibiotic. Pt wanted to let ST know her spleen is enlarged and she's following up with her physician in 6 weeks. Not sure if it could be related but she wanted him to know.PT also now has MEDICARE as primary insurance and BCBS as secondary and will update those when she come in October for her follow up appt.  New pharmacy is [facility] in [place]

## 2021-07-03 NOTE — Telephone Encounter (Signed)
Phone call to patient to let her know Dr Denna Haggard would like her to start MCN 50 BID x 1-2 months to see if that will help. She will contact her Physician to make sure its ok to take with her other issues going on.

## 2021-07-05 ENCOUNTER — Other Ambulatory Visit: Payer: Self-pay

## 2021-07-05 ENCOUNTER — Ambulatory Visit (HOSPITAL_BASED_OUTPATIENT_CLINIC_OR_DEPARTMENT_OTHER)
Admission: RE | Admit: 2021-07-05 | Discharge: 2021-07-05 | Disposition: A | Discharge status: Home / Self Care | Payer: Medicare Other | Source: Ambulatory Visit | Attending: Hematology & Oncology | Admitting: Hematology & Oncology

## 2021-07-05 DIAGNOSIS — R161 Splenomegaly, not elsewhere classified: Secondary | ICD-10-CM | POA: Diagnosis present

## 2021-07-11 ENCOUNTER — Ambulatory Visit: Payer: BC Managed Care – PPO | Admitting: Dermatology

## 2021-07-20 ENCOUNTER — Encounter: Payer: Self-pay | Admitting: Emergency Medicine

## 2021-07-20 ENCOUNTER — Other Ambulatory Visit: Payer: Self-pay

## 2021-07-20 ENCOUNTER — Emergency Department (INDEPENDENT_AMBULATORY_CARE_PROVIDER_SITE_OTHER)
Admission: EM | Admit: 2021-07-20 | Discharge: 2021-07-20 | Disposition: A | Discharge status: Home / Self Care | Payer: Medicare Other | Source: Home / Self Care | Attending: Family Medicine | Admitting: Family Medicine

## 2021-07-20 DIAGNOSIS — R21 Rash and other nonspecific skin eruption: Secondary | ICD-10-CM

## 2021-07-20 DIAGNOSIS — R509 Fever, unspecified: Secondary | ICD-10-CM

## 2021-07-20 DIAGNOSIS — L920 Granuloma annulare: Secondary | ICD-10-CM | POA: Insufficient documentation

## 2021-07-20 LAB — CBC WITH DIFFERENTIAL/PLATELET
Absolute Monocytes: 503 cells/uL (ref 200–950)
Basophils Absolute: 80 cells/uL (ref 0–200)
Basophils Relative: 1.2 %
Eosinophils Absolute: 409 cells/uL (ref 15–500)
Eosinophils Relative: 6.1 %
HCT: 34.2 % — ABNORMAL LOW (ref 35.0–45.0)
Hemoglobin: 11.8 g/dL (ref 11.7–15.5)
Lymphs Abs: 871 cells/uL (ref 850–3900)
MCH: 29.4 pg (ref 27.0–33.0)
MCHC: 34.5 g/dL (ref 32.0–36.0)
MCV: 85.1 fL (ref 80.0–100.0)
MPV: 11.1 fL (ref 7.5–12.5)
Monocytes Relative: 7.5 %
Neutro Abs: 4837 cells/uL (ref 1500–7800)
Neutrophils Relative %: 72.2 %
Platelets: 281 10*3/uL (ref 140–400)
RBC: 4.02 10*6/uL (ref 3.80–5.10)
RDW: 12.9 % (ref 11.0–15.0)
Total Lymphocyte: 13 %
WBC: 6.7 10*3/uL (ref 3.8–10.8)

## 2021-07-20 LAB — COMPLETE METABOLIC PANEL WITH GFR
AG Ratio: 1.7 (calc) (ref 1.0–2.5)
ALT: 12 U/L (ref 6–29)
AST: 14 U/L (ref 10–35)
Albumin: 3.9 g/dL (ref 3.6–5.1)
Alkaline phosphatase (APISO): 72 U/L (ref 37–153)
BUN: 8 mg/dL (ref 7–25)
CO2: 22 mmol/L (ref 20–32)
Calcium: 9.5 mg/dL (ref 8.6–10.4)
Chloride: 106 mmol/L (ref 98–110)
Creat: 0.91 mg/dL (ref 0.50–1.03)
Globulin: 2.3 g/dL (calc) (ref 1.9–3.7)
Glucose, Bld: 87 mg/dL (ref 65–99)
Potassium: 3.6 mmol/L (ref 3.5–5.3)
Sodium: 140 mmol/L (ref 135–146)
Total Bilirubin: 0.4 mg/dL (ref 0.2–1.2)
Total Protein: 6.2 g/dL (ref 6.1–8.1)
eGFR: 74 mL/min/{1.73_m2} (ref >=60)

## 2021-07-20 MED ORDER — PREDNISONE 10 MG (21) PO TBPK: ORAL_TABLET | Freq: Every day | ORAL | 0 refills | Status: DC

## 2021-07-20 NOTE — ED Provider Notes (Signed)
[facility]    CSN: [id number] Arrival date & time: 07/20/21  0915      History   Chief Complaint Chief Complaint  Patient presents with   Rash    HPI Catherine Fry is a 55 y.o. female.   HPI  Patient is here for a rash.  She states that its been present for 5 days and is getting progressively worse.  The bumps itch.  They hurt if you press on them.  She states it started a couple days prior to that with fever and some body aches.  These went away.  She is done a couple COVID test and they were negative.  She has been putting Benadryl cream on them.  Her highest fever was 102.6.  She is retired.  Home with her husband.  Husband has no symptoms.  She is COVID vaccinated.  She has never had a rash like this before.  She has no known allergies.  She has not had any new soaps lotions powders or products, no new medication or supplements. She does have a dermatologist.  She does not think to call the dermatologist today when she decided to get medical care for her rash. Patient has hypertension.  Well-controlled.  GERD on Protonix.  She states she has bipolar illness and anxiety, and is disabled at this time from working  Past Medical History:  Diagnosis Date   Atypical mole    moderte right upper buttock   Insomnia    Tachycardia     There are no problems to display for this patient.   Past Surgical History:  Procedure Laterality Date   OOPHORECTOMY Right    TONSILLECTOMY      OB History   No obstetric history on file.      Home Medications    Prior to Admission medications   Medication Sig Start Date End Date Taking? Authorizing Provider  ALPRAZolam Duanne Moron) 1 MG tablet Take 1 mg by mouth daily as needed. 03/13/21  Yes [provider]  clonazePAM (KLONOPIN) 1 MG tablet Take 2 mg by mouth at bedtime.   Yes [provider]  gabapentin (NEURONTIN) 100 MG capsule Take 300 mg by mouth at bedtime. 12/02/20  Yes [provider]   lamoTRIgine (LAMICTAL) 200 MG tablet Take 200 mg by mouth daily.   Yes [provider]  linaclotide (LINZESS) 290 MCG CAPS capsule Take 290 mcg by mouth daily before breakfast.   Yes [provider]  melatonin 5 MG TABS 2 gummies   Yes [provider]  metoprolol succinate (TOPROL-XL) 25 MG 24 hr tablet Take 1 tablet by mouth daily. 04/25/21  Yes [provider]  pantoprazole (PROTONIX) 40 MG tablet Take 40 mg by mouth daily. 10/18/20  Yes [provider]  predniSONE (STERAPRED UNI-PAK 21 TAB) 10 MG (21) TBPK tablet Take by mouth daily. Take 6 tabs by mouth daily  for 2 days, then 5 tabs for 2 days, then 4 tabs for 2 days, then 3 tabs for 2 days, 2 tabs for 2 days, then 1 tab by mouth daily for 2 days 07/20/21  Yes Raylene Everts, MD  QUEtiapine (SEROQUEL) 400 MG tablet Take 400 mg by mouth at bedtime.   Yes [provider]  meloxicam (MOBIC) 15 MG tablet Take 15 mg by mouth daily as needed. Patient not taking: Reported on 06/19/2021 06/12/21   [provider]  minocycline (MINOCIN) 50 MG capsule Take 1 capsule (50 mg total) by  mouth 2 (two) times daily. 07/03/21   Lavonna Monarch, MD  Minocycline HCl Micronized (AMZEEQ) 4 % FOAM Apply 1 application topically at bedtime. 05/15/21   Lavonna Monarch, MD    Family History Family History  Problem Relation Age of Onset   Cancer Mother        breast   Lymphoma Father    Alzheimer's disease Father     Social History Social History   Tobacco Use   Smoking status: Never   Smokeless tobacco: Never  Vaping Use   Vaping Use: Never used  Substance Use Topics   Alcohol use: Yes   Drug use: No     Allergies   Patient has no known allergies.   Review of Systems Review of Systems  Se HPI Physical Exam Triage Vital Signs ED Triage Vitals  Enc Vitals Group     BP 07/20/21 0933 112/75     Pulse Rate 07/20/21 0933 (!) 101     Resp --      Temp 07/20/21 0933 98.9 F (37.2 C)      Temp Source 07/20/21 0933 Oral     SpO2 07/20/21 0933 99 %     Weight 07/20/21 0935 165 lb (74.8 kg)     Height 07/20/21 0935 '5\' 4"'$  (1.626 m)     Head Circumference --      Peak Flow --      Pain Score 07/20/21 0935 5     Pain Loc --      Pain Edu? --      Excl. in [place]? --    No data found.  Updated Vital Signs BP 112/75 (BP Location: Right Arm)   Pulse (!) 101   Temp 98.9 F (37.2 C) (Oral)   Ht '5\' 4"'$  (1.626 m)   Wt 74.8 kg   SpO2 99%   BMI 28.32 kg/m        Physical Exam Constitutional:      General: She is not in acute distress.    Appearance: She is well-developed and normal weight.  HENT:     Head: Normocephalic and atraumatic.     Right Ear: Tympanic membrane, ear canal and external ear normal.     Left Ear: Tympanic membrane, ear canal and external ear normal.     Nose: Nose normal. No congestion.     Mouth/Throat:     Mouth: Mucous membranes are moist.     Pharynx: No posterior oropharyngeal erythema.  Eyes:     Conjunctiva/sclera: Conjunctivae normal.     Pupils: Pupils are equal, round, and reactive to light.  Cardiovascular:     Rate and Rhythm: Normal rate and regular rhythm.     Heart sounds: Normal heart sounds.  Pulmonary:     Effort: Pulmonary effort is normal. No respiratory distress.     Breath sounds: Normal breath sounds. No wheezing or rales.  Abdominal:     General: Abdomen is flat. Bowel sounds are normal. There is no distension.     Palpations: Abdomen is soft.     Comments: Soft.  Generalized mild tenderness.  Active bowel sounds  Musculoskeletal:        General: Normal range of motion.     Cervical back: Normal range of motion and neck supple.  Lymphadenopathy:     Cervical: No cervical adenopathy.  Skin:    General: Skin is warm and dry.     Findings: Lesion present.     Comments: See photographs.  There  are sporadic lesions present on the arms legs and upper back only.  All of these are on sun exposed areas.  Areas covered with  clothing are not affected.  Nontender to palpation.  Neurological:     Mental Status: She is alert.        UC Treatments / Results  Labs (all labs ordered are listed, but only abnormal results are displayed) Labs Reviewed  COVID-19, FLU A+B NAA  COMPLETE METABOLIC PANEL WITH GFR  CBC WITH DIFFERENTIAL/PLATELET    EKG   Radiology No results found.  Procedures Procedures (including critical care time)  Medications Ordered in UC Medications - No data to display  Initial Impression / Assessment and Plan / UC Course  I have reviewed the triage vital signs and the nursing notes.  Pertinent labs & imaging results that were available during my care of the patient were reviewed by me and considered in my medical decision making (see chart for details).     Patient has concerns regarding multiple rashes.  She specifically asks about monkey box, staph, strep, impetigo, ringworm, COVID.  I told her that this rash is none of those things.  My best guess, given the distribution, is an allergic reaction to an insect bite.  Unclear relationship to the fever that preceded the rash.  We will do some blood work and treated with prednisone.  She is encouraged to see her dermatologist next week especially if the rash fails to improve. Final Clinical Impressions(s) / UC Diagnoses   Final diagnoses:  Rash and nonspecific skin eruption  Fever in adult     Discharge Instructions      Try to stay out of the heat Take zyrtec 2 x a day for antihistamine Take the prednisone as directed ( take all of day one today) Call your dermatologist for appointment next week Do not scratch Call or return for problems      ED Prescriptions     Medication Sig Dispense Auth. Provider   predniSONE (STERAPRED UNI-PAK 21 TAB) 10 MG (21) TBPK tablet Take by mouth daily. Take 6 tabs by mouth daily  for 2 days, then 5 tabs for 2 days, then 4 tabs for 2 days, then 3 tabs for 2 days, 2 tabs for 2 days,  then 1 tab by mouth daily for 2 days 42 tablet Meda Coffee Jennette Banker, MD      PDMP not reviewed this encounter.   Raylene Everts, MD 07/20/21 [phone]

## 2021-07-20 NOTE — ED Triage Notes (Signed)
Patient states that she's had a fever last week and has developed a rash on both legs and arms since Sunday.  The rash does itch off and on, some redness and only painful to the touch.  Patient is applying Benadryl and Cortisone cream.  Negative PCR COVID test yesterday.  Highest fever of 102.6.

## 2021-07-20 NOTE — Discharge Instructions (Addendum)
Try to stay out of the heat Take zyrtec 2 x a day for antihistamine Take the prednisone as directed ( take all of day one today) Call your dermatologist for appointment next week Do not scratch Call or return for problems

## 2021-07-21 ENCOUNTER — Telehealth: Payer: Self-pay | Admitting: Emergency Medicine

## 2021-07-21 NOTE — Telephone Encounter (Signed)
Call from [place] to see if she could have a mono test added to her labs- reviewed labs with Kathi Ludwig, FNP. Pt directed to follow up with her pcp for additional lab work - [employer] virus, Parvo virus and tick related diseases. Per Manuela Schwartz she has been bitten by 4 ticks over the last year. Pt is currently w/ [facility] - given information on Primary Care at [facility]

## 2021-07-22 LAB — COVID-19, FLU A+B NAA
Influenza A, NAA: NOT DETECTED
Influenza B, NAA: NOT DETECTED
SARS-CoV-2, NAA: NOT DETECTED

## 2021-08-16 ENCOUNTER — Telehealth: Payer: Self-pay | Admitting: *Deleted

## 2021-08-16 NOTE — Telephone Encounter (Signed)
Message received from patient requesting to be tested for mono and strep.  Call placed back to patient and patient notified to contact her PCP to be tested for mono and strep. Pt states that she will contact her PCP and is appreciative of call back.

## 2021-08-17 ENCOUNTER — Other Ambulatory Visit: Payer: Self-pay

## 2021-08-17 ENCOUNTER — Ambulatory Visit (INDEPENDENT_AMBULATORY_CARE_PROVIDER_SITE_OTHER): Payer: Medicare Other | Admitting: Dermatology

## 2021-08-17 ENCOUNTER — Telehealth: Payer: Self-pay | Admitting: Dermatology

## 2021-08-17 ENCOUNTER — Encounter: Payer: Self-pay | Admitting: Dermatology

## 2021-08-17 DIAGNOSIS — D485 Neoplasm of uncertain behavior of skin: Secondary | ICD-10-CM | POA: Diagnosis not present

## 2021-08-17 DIAGNOSIS — L309 Dermatitis, unspecified: Secondary | ICD-10-CM | POA: Diagnosis not present

## 2021-08-17 NOTE — Patient Instructions (Signed)

## 2021-08-17 NOTE — Telephone Encounter (Signed)
Patient calling to let us know that she is due to get shingles shot today at 11 and was wanting to know if tafeen thinks she should wait. Per Dr Denna Haggard he would put off getting 2nd vaccine for shingles for 2-4 weeks until the issue we saw her for today has somewhat resolved.

## 2021-08-17 NOTE — Telephone Encounter (Signed)
Patient is due at 11:00 this morning (08/16/2021) and wants to know if she can get it.

## 2021-08-21 ENCOUNTER — Telehealth: Payer: Self-pay | Admitting: Dermatology

## 2021-08-21 NOTE — Telephone Encounter (Signed)
Patient calling for Bx results. I informed patient we are waiting for provider to review results and nurse will call back.

## 2021-08-22 ENCOUNTER — Encounter: Payer: Self-pay | Admitting: Dermatology

## 2021-08-22 ENCOUNTER — Telehealth: Payer: Self-pay | Admitting: *Deleted

## 2021-08-22 MED ORDER — TRIAMCINOLONE ACETONIDE 0.1 % EX CREA: 1.0000 "application " | TOPICAL_CREAM | Freq: Every day | CUTANEOUS | 0 refills | Status: DC | PRN

## 2021-08-22 NOTE — Telephone Encounter (Signed)
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  The cause of GA remains quite unknown and Dr. Denna Haggard is unsure how this fits into her febrile illness.  The oral cortisone/prednisone that she had been put on elsewhere might temporarily suppress or clear the GA, but it is not a good long-term strategy.  She certainly may try a topical (triamcinolone) daily after bathing, but there remains no true cure for granuloma annulare.  There is some tendency for this condition to simply go away on its own.  I would like her scheduled (if she is not already) for a recheck in 4 weeks.

## 2021-08-22 NOTE — Telephone Encounter (Signed)
Pathology report to patient. Sent in prescription for Triamcinolone cream. Patient does have a follow up in October.

## 2021-08-26 ENCOUNTER — Encounter: Payer: Self-pay | Admitting: Dermatology

## 2021-08-26 NOTE — Progress Notes (Signed)
   Follow-Up Visit   Subjective  Catherine Fry is a 55 y.o. female who presents for the following: Skin Problem (Fever, muscle aches x 1 month ago- negative for covid- few days after fever got,blister on legs and arms, got a fever ago few days later and got blisters again- went to urgent to care and given prednisone x 12 days- help with lesions - no itch no bleed - recently found out about 1 year ago spleen is enlarged and seeing hematologist ).  Recurring rash, no symptoms, but associated with relapsing fever.  Was treated with a 12-day course of oral prednisone with transient improvement. Location:  Duration:  Quality:  Associated Signs/Symptoms: Modifying Factors:  Severity:  Timing: Context:   Objective  Well appearing patient in no apparent distress; mood and affect are within normal limits. Multiple patches of urticarial dermatitis on extremities and upper torso.  No mucositis.  No adenopathy.  Right Thigh - Anterior Urticarial dermatitis 1 cm  Right Arm Urticarial dermatitis 1 cm    A full examination was performed including scalp, head, eyes, ears, nose, lips, neck, chest, axillae, abdomen, back, buttocks, bilateral upper extremities, bilateral lower extremities, hands, feet, fingers, toes, fingernails, and toenails. All findings within normal limits unless otherwise noted below.  Areas beneath undergarments not fully examined.   Assessment & Plan    Neoplasm of uncertain behavior of skin (2) Right Thigh - Anterior  Skin / nail biopsy Type of biopsy: tangential   Informed consent: discussed and consent obtained   Timeout: patient name, date of birth, surgical site, and procedure verified   Procedure prep:  Patient was prepped and draped in usual sterile fashion (Non sterile) Prep type:  Chlorhexidine Anesthesia: the lesion was anesthetized in a standard fashion   Anesthetic:  1% lidocaine w/ epinephrine 1-100,000 local infiltration Instrument used: flexible  razor blade   Outcome: patient tolerated procedure well   Post-procedure details: wound care instructions given    Specimen 1 - Surgical pathology Differential Diagnosis:  PLEVA, r/o viremia, r/o auto immune (2 Specimens) Michels Solution  Check Margins: No  Right Arm  Skin / nail biopsy Type of biopsy: tangential   Informed consent: discussed and consent obtained   Timeout: patient name, date of birth, surgical site, and procedure verified   Procedure prep:  Patient was prepped and draped in usual sterile fashion (Non sterile) Prep type:  Chlorhexidine Anesthesia: the lesion was anesthetized in a standard fashion   Anesthetic:  1% lidocaine w/ epinephrine 1-100,000 local infiltration Instrument used: flexible razor blade   Outcome: patient tolerated procedure well   Post-procedure details: wound care instructions given    Specimen 2 - Surgical pathology Differential Diagnosis:    PLEVA, r/o viremia, r/o auto immune (2 specimens)  Check Margins: No  2 biopsies obtained in each specimen divided into, half of each sent for routine histology and half of each sent for direct immunofluorescence.  Additionally noted on medical review she has mild splenomegaly with no functional issues.  She was thoroughly evaluated by Dr. Burney Gauze.  Dermatitis      I, Lavonna Monarch, MD, have reviewed all documentation for this visit.  The documentation on 08/26/21 for the exam, diagnosis, procedures, and orders are all accurate and complete.

## 2021-08-27 ENCOUNTER — Other Ambulatory Visit: Payer: Self-pay | Admitting: Dermatology

## 2021-09-04 ENCOUNTER — Inpatient Hospital Stay: Payer: Medicare Other | Attending: Hematology & Oncology

## 2021-09-04 ENCOUNTER — Other Ambulatory Visit: Payer: Self-pay

## 2021-09-04 ENCOUNTER — Encounter: Payer: Self-pay | Admitting: Hematology & Oncology

## 2021-09-04 ENCOUNTER — Inpatient Hospital Stay (HOSPITAL_BASED_OUTPATIENT_CLINIC_OR_DEPARTMENT_OTHER): Payer: Medicare Other | Admitting: Hematology & Oncology

## 2021-09-04 VITALS — BP 105/70 | HR 82 | Temp 98.8°F | Resp 18 | Ht 64.0 in | Wt 170.8 lb

## 2021-09-04 DIAGNOSIS — L920 Granuloma annulare: Secondary | ICD-10-CM | POA: Insufficient documentation

## 2021-09-04 DIAGNOSIS — R161 Splenomegaly, not elsewhere classified: Secondary | ICD-10-CM | POA: Diagnosis present

## 2021-09-04 LAB — CBC WITH DIFFERENTIAL (CANCER CENTER ONLY)
Abs Immature Granulocytes: 0.05 10*3/uL (ref 0.00–0.07)
Basophils Absolute: 0.1 10*3/uL (ref 0.0–0.1)
Basophils Relative: 1 %
Eosinophils Absolute: 0.2 10*3/uL (ref 0.0–0.5)
Eosinophils Relative: 3 %
HCT: 38.3 % (ref 36.0–46.0)
Hemoglobin: 12.9 g/dL (ref 12.0–15.0)
Immature Granulocytes: 1 %
Lymphocytes Relative: 26 %
Lymphs Abs: 1.9 10*3/uL (ref 0.7–4.0)
MCH: 29.2 pg (ref 26.0–34.0)
MCHC: 33.7 g/dL (ref 30.0–36.0)
MCV: 86.7 fL (ref 80.0–100.0)
Monocytes Absolute: 0.4 10*3/uL (ref 0.1–1.0)
Monocytes Relative: 5 %
Neutro Abs: 4.8 10*3/uL (ref 1.7–7.7)
Neutrophils Relative %: 64 %
Platelet Count: 261 10*3/uL (ref 150–400)
RBC: 4.42 MIL/uL (ref 3.87–5.11)
RDW: 13.6 % (ref 11.5–15.5)
WBC Count: 7.5 10*3/uL (ref 4.0–10.5)
nRBC: 0 % (ref 0.0–0.2)

## 2021-09-04 LAB — CMP (CANCER CENTER ONLY)
ALT: 11 U/L (ref 0–44)
AST: 14 U/L — ABNORMAL LOW (ref 15–41)
Albumin: 4.7 g/dL (ref 3.5–5.0)
Alkaline Phosphatase: 68 U/L (ref 38–126)
Anion gap: 10 (ref 5–15)
BUN: 19 mg/dL (ref 6–20)
CO2: 27 mmol/L (ref 22–32)
Calcium: 10.3 mg/dL (ref 8.9–10.3)
Chloride: 105 mmol/L (ref 98–111)
Creatinine: 1.1 mg/dL — ABNORMAL HIGH (ref 0.44–1.00)
GFR, Estimated: 59 mL/min — ABNORMAL LOW (ref >60)
Glucose, Bld: 91 mg/dL (ref 70–99)
Potassium: 4.1 mmol/L (ref 3.5–5.1)
Sodium: 142 mmol/L (ref 135–145)
Total Bilirubin: 0.4 mg/dL (ref 0.3–1.2)
Total Protein: 7 g/dL (ref 6.5–8.1)

## 2021-09-04 LAB — LACTATE DEHYDROGENASE: LDH: 183 U/L (ref 98–192)

## 2021-09-04 LAB — SAVE SMEAR(SSMR), FOR PROVIDER SLIDE REVIEW

## 2021-09-04 NOTE — Progress Notes (Signed)
Hematology and Oncology Follow Up Visit  Catherine Fry [id number] August 29, 1966 55 y.o. 09/04/2021   Principle Diagnosis:  Splenomegaly-mild, likely idiopathic  Current Therapy:   Observation     Interim History:  Catherine Fry is back for a second office visit.  We first saw her back in July.  At that time, we did do an ultrasound of her abdomen.  She had a mild splenomegaly with a volume of 300 cm.  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  This was several weeks ago.  She actually saw her dermatologist and a biopsy was done which proved this.  She has had multiple lab test done for infections.  So far, everything is unremarkable.  I cannot imagine that this is related to anything with respect to the splenomegaly.  She has had no swollen lymph nodes.  She has had no fever.  She has had no bleeding.  There is no change in bowel or bladder habits.  Of note, this skin issue seemed to happen after she got back from the beach.  She did not eat any deep sea fish.  She is feeling better.  The rash seems to be improving.  Overall, she has had no issues with cough.  There is been no COVID problems.  She been tested for COVID a couple times.  She has had no nausea or vomiting.  Overall, I would say performance status is ECOG 0.    Medications:  Current Outpatient Medications:    ALPRAZolam (XANAX) 1 MG tablet, Take 1 mg by mouth daily as needed., Disp: , Rfl:    clonazePAM (KLONOPIN) 1 MG tablet, Take 2 mg by mouth at bedtime., Disp: , Rfl:    gabapentin (NEURONTIN) 100 MG capsule, Take 300 mg by mouth at bedtime., Disp: , Rfl:    lamoTRIgine (LAMICTAL) 200 MG tablet, Take 200 mg by mouth daily., Disp: , Rfl:    linaclotide (LINZESS) 290 MCG CAPS capsule, Take 290 mcg by mouth daily before breakfast., Disp: , Rfl:    melatonin 5 MG TABS, 2 gummies, Disp: , Rfl:    metoprolol succinate (TOPROL-XL) 25 MG 24 hr tablet, Take 1 tablet by mouth daily.,  Disp: , Rfl:    pantoprazole (PROTONIX) 40 MG tablet, Take 40 mg by mouth daily., Disp: , Rfl:    QUEtiapine (SEROQUEL) 400 MG tablet, Take 400 mg by mouth at bedtime., Disp: , Rfl:    triamcinolone cream (KENALOG) 0.1 %, Apply 1 application topically daily as needed., Disp: 453 g, Rfl: 0  Allergies: No Known Allergies  Past Medical History, Surgical history, Social history, and Family History were reviewed and updated.  Review of Systems: Review of Systems  Constitutional: Negative.   HENT:  Negative.    Eyes: Negative.   Respiratory: Negative.    Cardiovascular: Negative.   Gastrointestinal: Negative.   Endocrine: Negative.   Genitourinary: Negative.    Musculoskeletal: Negative.   Skin:  Positive for rash.  Neurological: Negative.   Hematological: Negative.   Psychiatric/Behavioral: Negative.     Physical Exam:  height is 5\' 4"  (1.626 m) and weight is 170 lb 12.8 oz (77.5 kg). Her oral temperature is 98.8 F (37.1 C). Her blood pressure is 105/70 and her pulse is 82. Her respiration is 18 and oxygen saturation is 100%.   Wt Readings from Last 3 Encounters:  09/04/21 170 lb 12.8 oz (77.5 kg)  07/20/21 165 lb (74.8 kg)  06/19/21 179 lb 6.4  oz (81.4 kg)    Physical Exam Vitals reviewed.  HENT:     Head: Normocephalic and atraumatic.  Eyes:     Pupils: Pupils are equal, round, and reactive to light.  Cardiovascular:     Rate and Rhythm: Normal rate and regular rhythm.     Heart sounds: Normal heart sounds.  Pulmonary:     Effort: Pulmonary effort is normal.     Breath sounds: Normal breath sounds.  Abdominal:     General: Bowel sounds are normal.     Palpations: Abdomen is soft.  Musculoskeletal:        General: No tenderness or deformity. Normal range of motion.     Cervical back: Normal range of motion.  Lymphadenopathy:     Cervical: No cervical adenopathy.  Skin:    General: Skin is warm and dry.     Findings: No erythema or rash.  Neurological:      Mental Status: She is alert and oriented to person, place, and time.  Psychiatric:        Behavior: Behavior normal.        Thought Content: Thought content normal.        Judgment: Judgment normal.     Lab Results  Component Value Date   WBC 7.5 09/04/2021   HGB 12.9 09/04/2021   HCT 38.3 09/04/2021   MCV 86.7 09/04/2021   PLT 261 09/04/2021     Chemistry      Component Value Date/Time   NA 142 09/04/2021 1432   K 4.1 09/04/2021 1432   CL 105 09/04/2021 1432   CO2 27 09/04/2021 1432   BUN 19 09/04/2021 1432   CREATININE 1.10 (H) 09/04/2021 1432   CREATININE 0.91 07/20/2021 1017      Component Value Date/Time   CALCIUM 10.3 09/04/2021 1432   ALKPHOS 68 09/04/2021 1432   AST 14 (L) 09/04/2021 1432   ALT 11 09/04/2021 1432   BILITOT 0.4 09/04/2021 1432      Impression and Plan: Catherine Fry is a very charming 55 year old white female.  She had mild, at best, splenomegaly.  Again, I do believe this can be idiopathic in not malignant in any way.  I just cannot imagine that this is ever going to be a problem for her.  For right now, I really think that we can get her through the holiday season.  I would like to do another ultrasound of her spleen the day we see her back.  I think this would be quite reasonable.  Again, I do not see that where the granuloma annulare was related to her mild splenomegaly.  As always, it was fine talking to her.  She is incredibly interesting to talk to.  She definitely has a good attitude.   Volanda Napoleon, MD 9/26/20224:39 PM

## 2021-09-05 ENCOUNTER — Telehealth: Payer: Self-pay | Admitting: Hematology & Oncology

## 2021-09-05 NOTE — Telephone Encounter (Signed)
Scheduled appt per 9/26 los - mailed letter with apt date and time

## 2021-09-11 ENCOUNTER — Ambulatory Visit (INDEPENDENT_AMBULATORY_CARE_PROVIDER_SITE_OTHER): Payer: Medicare Other | Admitting: Dermatology

## 2021-09-11 ENCOUNTER — Encounter: Payer: Self-pay | Admitting: Dermatology

## 2021-09-11 ENCOUNTER — Other Ambulatory Visit: Payer: Self-pay

## 2021-09-11 DIAGNOSIS — L308 Other specified dermatitis: Secondary | ICD-10-CM | POA: Diagnosis not present

## 2021-09-26 ENCOUNTER — Encounter: Payer: Self-pay | Admitting: Dermatology

## 2021-09-26 NOTE — Progress Notes (Signed)
   Follow-Up Visit   Subjective  Catherine Fry is a 55 y.o. female who presents for the following: Follow-up (Patient has a list of questions in her phone 1. Direct Immunofluorescence, right thigh - anterior/NEGATIVE FOR IMMUNOREACTANTS/2. Skin , right arm/GRANULOMA ANNULARE, EARLY OR INTERSTITIAL PATTERN).  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 Right Thigh - Anterior, Right Upper Arm - Posterior 25-minute anticoagulation about what is known as well as how much is unknown relating to the interstitial pattern of granuloma annulare or other interstitial granulomatous disorders.  She is actually feels that much of the rash has improved and I certainly will not start her on any systemic therapy if this may be beginning to self resolve.    A focused examination was performed including arms legs head and neck. Relevant physical exam findings are noted in the Assessment and Plan.   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  Follow-up by MyChart for status update in 4 weeks.      I, Lavonna Monarch, MD, have reviewed all documentation for this visit.  The documentation on 09/26/21 for the exam, diagnosis, procedures, and orders are all accurate and complete.

## 2021-10-03 ENCOUNTER — Ambulatory Visit: Payer: Medicare Other | Attending: Internal Medicine

## 2021-10-03 DIAGNOSIS — Z23 Encounter for immunization: Secondary | ICD-10-CM

## 2021-10-03 NOTE — Progress Notes (Signed)
   Covid-19 Vaccination Clinic  Name:  TARHONDA HOLLENBERG    MRN: [id number] DOB: 1966/07/25  10/03/2021  Ms. Kivi was observed post Covid-19 immunization for 15 minutes without incident. She was provided with Vaccine Information Sheet and instruction to access the V-Safe system.   Ms. Montroy was instructed to call 911 with any severe reactions post vaccine: Difficulty breathing  Swelling of face and throat  A fast heartbeat  A bad rash all over body  Dizziness and weakness   Immunizations Administered     Name Date Dose VIS Date Route   Moderna Covid-19 vaccine Bivalent Booster 10/03/2021  1:13 PM 0.5 mL 07/22/2021 Intramuscular   Manufacturer: Moderna   Lot: [id number]   [place]: [id number]

## 2021-10-27 ENCOUNTER — Other Ambulatory Visit (HOSPITAL_BASED_OUTPATIENT_CLINIC_OR_DEPARTMENT_OTHER): Payer: Self-pay

## 2021-10-27 MED ORDER — MODERNA COVID-19 BIVAL BOOSTER 50 MCG/0.5ML IM SUSP
INTRAMUSCULAR | 0 refills | Status: DC
  Filled 2021-10-27: qty 0.5, 1d supply, fill #0

## 2021-12-13 ENCOUNTER — Other Ambulatory Visit: Payer: Self-pay | Admitting: *Deleted

## 2021-12-13 NOTE — Telephone Encounter (Signed)
Patient called and asked if she could postpone her appts in Jan and come in May instead.  Seeing her Primary care physician in March.  Dr Marin Olp ok with this plan.  Called patient and left message on Machine.  Scheduling message sent

## 2021-12-14 ENCOUNTER — Other Ambulatory Visit: Payer: Self-pay | Admitting: *Deleted

## 2021-12-14 DIAGNOSIS — R161 Splenomegaly, not elsewhere classified: Secondary | ICD-10-CM

## 2021-12-18 ENCOUNTER — Other Ambulatory Visit: Payer: Self-pay | Admitting: *Deleted

## 2021-12-25 ENCOUNTER — Other Ambulatory Visit (HOSPITAL_BASED_OUTPATIENT_CLINIC_OR_DEPARTMENT_OTHER): Payer: Medicare Other

## 2021-12-25 ENCOUNTER — Other Ambulatory Visit: Payer: Medicare Other

## 2021-12-25 ENCOUNTER — Ambulatory Visit: Payer: Medicare Other | Admitting: Hematology & Oncology

## 2021-12-26 ENCOUNTER — Other Ambulatory Visit: Payer: Self-pay

## 2021-12-26 ENCOUNTER — Ambulatory Visit: Payer: Medicare PPO | Admitting: Dermatology

## 2021-12-26 ENCOUNTER — Encounter: Payer: Self-pay | Admitting: Dermatology

## 2021-12-26 DIAGNOSIS — Z1283 Encounter for screening for malignant neoplasm of skin: Secondary | ICD-10-CM

## 2021-12-26 DIAGNOSIS — L821 Other seborrheic keratosis: Secondary | ICD-10-CM

## 2021-12-26 DIAGNOSIS — L814 Other melanin hyperpigmentation: Secondary | ICD-10-CM | POA: Diagnosis not present

## 2022-01-17 ENCOUNTER — Encounter: Payer: Self-pay | Admitting: Dermatology

## 2022-01-17 NOTE — Progress Notes (Signed)
° °  Follow-Up Visit   Subjective  Catherine Fry is a 56 y.o. female who presents for the following: Annual Exam (Lesion under left beast- getting bigger).  General skin examination, several spots of concern particularly under left breast Location:  Duration:  Quality:  Associated Signs/Symptoms: Modifying Factors:  Severity:  Timing: Context:   Objective  Well appearing patient in no apparent distress; mood and affect are within normal limits. Torso - Posterior (Back) Full body skin examination: No atypical pigmented lesions or nonmelanoma skin cancer  Left Breast Typical tan textured 7 mm ovoid papule; typical dermoscopy.  Left Dorsum of Foot 3 mm monochrome symmetric brown macule, no dermoscopic atypia    A full examination was performed including scalp, head, eyes, ears, nose, lips, neck, chest, axillae, abdomen, back, buttocks, bilateral upper extremities, bilateral lower extremities, hands, feet, fingers, toes, fingernails, and toenails. All findings within normal limits unless otherwise noted below.  Areas beneath undergarments not fully examined.   Assessment & Plan    Encounter for screening for malignant neoplasm of skin Torso - Posterior (Back)  Annual skin examination, encouraged to self examine twice annually.  Continued ultraviolet protection.  Seborrheic keratosis Left Breast  I offered to obtain confirmatory biopsy but patient chooses to leave lesion unless there is clinical change  Lentigo Left Dorsum of Foot  Leave if stable      I, Lavonna Monarch, MD, have reviewed all documentation for this visit.  The documentation on 01/17/22 for the exam, diagnosis, procedures, and orders are all accurate and complete.

## 2022-04-05 ENCOUNTER — Ambulatory Visit (HOSPITAL_COMMUNITY)
Admission: RE | Admit: 2022-04-05 | Discharge: 2022-04-05 | Disposition: A | Discharge status: Home / Self Care | Payer: Medicare PPO | Source: Ambulatory Visit | Attending: Physical Medicine and Rehabilitation | Admitting: Physical Medicine and Rehabilitation

## 2022-04-05 ENCOUNTER — Other Ambulatory Visit (HOSPITAL_COMMUNITY): Payer: Self-pay | Admitting: Physical Medicine and Rehabilitation

## 2022-04-05 DIAGNOSIS — M7989 Other specified soft tissue disorders: Secondary | ICD-10-CM | POA: Insufficient documentation

## 2022-04-05 DIAGNOSIS — M79605 Pain in left leg: Secondary | ICD-10-CM | POA: Insufficient documentation

## 2022-04-05 NOTE — Progress Notes (Signed)
Left lower extremity venous duplex has been completed. ?Preliminary results can be found in CV Proc through chart review.  ?Results were given to Dr. Ron Agee. ? ?04/05/22 10:46 AM ?Carlos Levering RVT   ?

## 2022-04-24 ENCOUNTER — Emergency Department
Admission: EM | Admit: 2022-04-24 | Discharge: 2022-04-24 | Disposition: A | Discharge status: Home / Self Care | Payer: Medicare PPO | Source: Home / Self Care

## 2022-04-24 ENCOUNTER — Encounter: Payer: Self-pay | Admitting: Family Medicine

## 2022-04-24 DIAGNOSIS — R030 Elevated blood-pressure reading, without diagnosis of hypertension: Secondary | ICD-10-CM | POA: Diagnosis not present

## 2022-04-24 NOTE — Discharge Instructions (Addendum)
Instructed patient to buy appropriate blood pressure cuff for left arm/over brachial artery with proper form as demonstrated by RN this morning.  Instructed patient to take blood pressure first thing in the morning after voiding and prior to taking blood pressure medication and to log these measurements for cardiology so that they may better understand daily blood pressure trends. ?

## 2022-04-24 NOTE — ED Provider Notes (Signed)
?[name] ? ? ? ?CSN: [id number] ?Arrival date & time: 04/24/22  3846 ? ? ?  ? ?History   ?Chief Complaint ?Chief Complaint  ?Patient presents with  ? Hypertension  ?  Pt states she takes metoprolol for SVT and sees cardiology on Friday. Noticed elevated blood pressure readings 180/110 but then reading are back down. States she was told to follow up with cards Friday and that this was not an emergency per cardiology RN. She has been under increased stress for the last week,   ? ? ?HPI ?Catherine Fry is a 56 y.o. female.  ? ?HPI she 49-year-old female presents with elevated blood pressure.  Reports that she currently takes metoprolol for sinus tachycardia and will be evaluated by cardiology at normal schedule appointment on Friday, 04/27/2022.  Patient reports she was told increase in blood pressure was not an emergency per cardiology RN.  PMH significant for insomnia and tachycardia. ? ?Past Medical History:  ?Diagnosis Date  ? Atypical mole   ? moderte right upper buttock  ? Insomnia   ? Tachycardia   ? ? ?There are no problems to display for this patient. ? ? ?Past Surgical History:  ?Procedure Laterality Date  ? OOPHORECTOMY Right   ? TONSILLECTOMY    ? ? ?OB History   ?No obstetric history on file. ?  ? ? ? ?Home Medications   ? ?Prior to Admission medications   ?Medication Sig Start Date End Date Taking? Authorizing Provider  ?ALPRAZolam (XANAX) 1 MG tablet Take 1 mg by mouth daily as needed. 03/13/21   [provider]  ?clonazePAM (KLONOPIN) 1 MG tablet Take 2 mg by mouth at bedtime.    [provider]  ?COVID-19 mRNA bivalent vaccine, Moderna, (MODERNA COVID-19 BIVAL BOOSTER) 50 MCG/0.5ML injection Inject into the muscle. 10/03/21   Carlyle Basques, MD  ?gabapentin (NEURONTIN) 100 MG capsule Take 300 mg by mouth at bedtime. 12/02/20   [provider]  ?lamoTRIgine (LAMICTAL) 200 MG tablet Take 200 mg by mouth daily.    [provider]  ?linaclotide (LINZESS) 290  MCG CAPS capsule Take 290 mcg by mouth daily before breakfast.    [provider]  ?melatonin 5 MG TABS 2 gummies    [provider]  ?meloxicam (MOBIC) 15 MG tablet 1 tablet    [provider]  ?metoprolol succinate (TOPROL-XL) 25 MG 24 hr tablet Take 1 tablet by mouth daily. 04/25/21   [provider]  ?pantoprazole (PROTONIX) 20 MG tablet Take by mouth.    [provider]  ?pantoprazole (PROTONIX) 40 MG tablet Take 40 mg by mouth daily. 10/18/20   [provider]  ?QUEtiapine (SEROQUEL) 400 MG tablet Take 400 mg by mouth at bedtime.    [provider]  ?triamcinolone cream (KENALOG) 0.1 % Apply 1 application topically daily as needed. 08/22/21   Lavonna Monarch, MD  ? ? ?Family History ?Family History  ?Problem Relation Age of Onset  ? Cancer Mother   ?     breast  ? Lymphoma Father   ? Alzheimer's disease Father   ? ? ?Social History ?Social History  ? ?Tobacco Use  ? Smoking status: Never  ? Smokeless tobacco: Never  ?Vaping Use  ? Vaping Use: Never used  ?Substance Use Topics  ? Alcohol use: Yes  ? Drug use: No  ? ? ? ?Allergies   ?Minocycline ? ? ?Review of Systems ?Review of Systems  ?Cardiovascular:   ?  Concern of elevated blood pressure.  ?All other systems reviewed and are negative. ? ? ?Physical Exam ?Triage Vital Signs ?ED Triage Vitals  ?Enc Vitals Group  ?   BP   ?   Pulse   ?   Resp   ?   Temp   ?   Temp src   ?   SpO2   ?   Weight   ?   Height   ?   Head Circumference   ?   Peak Flow   ?   Pain Score   ?   Pain Loc   ?   Pain Edu?   ?   Excl. in [place]?   ? ?No data found. ? ?Updated Vital Signs ?BP 128/88 (BP Location: Left Arm)   Pulse 85   Temp 98.2 ?F (36.8 ?C) (Oral)   Resp 18   SpO2 100%  ? ?Physical Exam ?Vitals and nursing note reviewed.  ?Constitutional:   ?   Appearance: She is obese.  ?HENT:  ?   Head: Normocephalic and atraumatic.  ?   Mouth/Throat:  ?   Mouth: Mucous membranes are moist.  ?   Pharynx: Oropharynx is clear.   ?Eyes:  ?   Extraocular Movements: Extraocular movements intact.  ?   Conjunctiva/sclera: Conjunctivae normal.  ?   Pupils: Pupils are equal, round, and reactive to light.  ?Neck:  ?   Comments: No JVD, no bruit ?Cardiovascular:  ?   Rate and Rhythm: Normal rate and regular rhythm.  ?   Pulses: Normal pulses.  ?   Heart sounds: Normal heart sounds. No murmur heard. ?  No friction rub. No gallop.  ?Pulmonary:  ?   Effort: Pulmonary effort is normal.  ?   Breath sounds: Normal breath sounds. No wheezing, rhonchi or rales.  ?Musculoskeletal:  ?   Cervical back: Normal range of motion and neck supple.  ?Skin: ?   General: Skin is warm and dry.  ?Neurological:  ?   General: No focal deficit present.  ?   Mental Status: She is alert and oriented to person, place, and time.  ? ? ? ?UC Treatments / Results  ?Labs ?(all labs ordered are listed, but only abnormal results are displayed) ?Labs Reviewed - No data to display ? ?EKG ? ? ?Radiology ?No results found. ? ?Procedures ?Procedures (including critical care time) ? ?Medications Ordered in UC ?Medications - No data to display ? ?Initial Impression / Assessment and Plan / UC Course  ?I have reviewed the triage vital signs and the nursing notes. ? ?Pertinent labs & imaging results that were available during my care of the patient were reviewed by me and considered in my medical decision making (see chart for details). ? ?  ? ?MDM: 1.  Elevated blood pressure without diagnosis of hypertension-Instructed patient to buy appropriate blood pressure cuff for left arm/over brachial artery with proper form as demonstrated by RN this morning.  Instructed patient to take blood pressure first thing in the morning after voiding and prior to taking blood pressure medication and to log these measurements for cardiology so that they may better understand daily blood pressure trends.  Patient discharged home, hemodynamically stable. ?Final Clinical Impressions(s) / UC Diagnoses  ? ?Final  diagnoses:  ?Elevated blood pressure reading without diagnosis of hypertension  ? ? ? ?Discharge Instructions   ? ?  ?Instructed patient to buy appropriate blood pressure cuff for left arm/over brachial artery with proper form as demonstrated  by RN this morning.  Instructed patient to take blood pressure first thing in the morning after voiding and prior to taking blood pressure medication and to log these measurements for cardiology so that they may better understand daily blood pressure trends. ? ? ? ? ?ED Prescriptions   ?None ?  ? ?PDMP not reviewed this encounter. ?  ?Eliezer Lofts, [place] ?04/24/22 1028 ? ?

## 2022-04-24 NOTE — ED Triage Notes (Signed)
Pt states she takes metoprolol for SVT and sees cardiology on Friday. Noticed elevated blood pressure readings 180/110 but then reading are back down. States she was told to follow up with cards Friday and that this was not an emergency per cardiology RN. She has been under increased stress for the last week,  ?

## 2022-04-26 ENCOUNTER — Ambulatory Visit (HOSPITAL_BASED_OUTPATIENT_CLINIC_OR_DEPARTMENT_OTHER)
Admission: RE | Admit: 2022-04-26 | Discharge: 2022-04-26 | Disposition: A | Discharge status: Home / Self Care | Payer: Medicare PPO | Source: Ambulatory Visit | Attending: Hematology & Oncology | Admitting: Hematology & Oncology

## 2022-04-26 ENCOUNTER — Inpatient Hospital Stay: Payer: Medicare PPO | Admitting: Hematology & Oncology

## 2022-04-26 ENCOUNTER — Inpatient Hospital Stay: Payer: Medicare PPO | Attending: Hematology & Oncology

## 2022-04-26 ENCOUNTER — Encounter: Payer: Self-pay | Admitting: Hematology & Oncology

## 2022-04-26 ENCOUNTER — Other Ambulatory Visit: Payer: Self-pay

## 2022-04-26 VITALS — BP 122/84 | HR 73 | Temp 98.0°F | Resp 18 | Wt 167.0 lb

## 2022-04-26 DIAGNOSIS — F419 Anxiety disorder, unspecified: Secondary | ICD-10-CM | POA: Insufficient documentation

## 2022-04-26 DIAGNOSIS — R161 Splenomegaly, not elsewhere classified: Secondary | ICD-10-CM | POA: Insufficient documentation

## 2022-04-26 DIAGNOSIS — Z79899 Other long term (current) drug therapy: Secondary | ICD-10-CM | POA: Insufficient documentation

## 2022-04-26 LAB — CMP (CANCER CENTER ONLY)
ALT: 19 U/L (ref 0–44)
AST: 19 U/L (ref 15–41)
Albumin: 4.6 g/dL (ref 3.5–5.0)
Alkaline Phosphatase: 58 U/L (ref 38–126)
Anion gap: 10 (ref 5–15)
BUN: 14 mg/dL (ref 6–20)
CO2: 26 mmol/L (ref 22–32)
Calcium: 10.4 mg/dL — ABNORMAL HIGH (ref 8.9–10.3)
Chloride: 104 mmol/L (ref 98–111)
Creatinine: 1.1 mg/dL — ABNORMAL HIGH (ref 0.44–1.00)
GFR, Estimated: 59 mL/min — ABNORMAL LOW (ref >60)
Glucose, Bld: 85 mg/dL (ref 70–99)
Potassium: 3.7 mmol/L (ref 3.5–5.1)
Sodium: 140 mmol/L (ref 135–145)
Total Bilirubin: 0.6 mg/dL (ref 0.3–1.2)
Total Protein: 7.1 g/dL (ref 6.5–8.1)

## 2022-04-26 LAB — CBC WITH DIFFERENTIAL (CANCER CENTER ONLY)
Abs Immature Granulocytes: 0.18 10*3/uL — ABNORMAL HIGH (ref 0.00–0.07)
Basophils Absolute: 0.1 10*3/uL (ref 0.0–0.1)
Basophils Relative: 2 %
Eosinophils Absolute: 0.2 10*3/uL (ref 0.0–0.5)
Eosinophils Relative: 3 %
HCT: 38.6 % (ref 36.0–46.0)
Hemoglobin: 13.1 g/dL (ref 12.0–15.0)
Immature Granulocytes: 3 %
Lymphocytes Relative: 24 %
Lymphs Abs: 1.5 10*3/uL (ref 0.7–4.0)
MCH: 31 pg (ref 26.0–34.0)
MCHC: 33.9 g/dL (ref 30.0–36.0)
MCV: 91.5 fL (ref 80.0–100.0)
Monocytes Absolute: 0.4 10*3/uL (ref 0.1–1.0)
Monocytes Relative: 7 %
Neutro Abs: 3.8 10*3/uL (ref 1.7–7.7)
Neutrophils Relative %: 61 %
Platelet Count: 290 10*3/uL (ref 150–400)
RBC: 4.22 MIL/uL (ref 3.87–5.11)
RDW: 14 % (ref 11.5–15.5)
WBC Count: 6.2 10*3/uL (ref 4.0–10.5)
nRBC: 0 % (ref 0.0–0.2)

## 2022-04-26 LAB — SAVE SMEAR(SSMR), FOR PROVIDER SLIDE REVIEW

## 2022-04-26 LAB — LACTATE DEHYDROGENASE: LDH: 253 U/L — ABNORMAL HIGH (ref 98–192)

## 2022-04-26 NOTE — Progress Notes (Signed)
Hematology and Oncology Follow Up Visit  Catherine Fry [id number] Feb 11, 1966 56 y.o. 04/26/2022   Principle Diagnosis:  Splenomegaly-mild, likely idiopathic  Current Therapy:   Observation     Interim History:  Catherine Fry is back for a follow-up.  It is always fun seeing her.  She is doing well.  She had a very interesting trip down to [place].  She went by herself.  She talked me about this.  She does have a lot of anxiety.  I told her that she is doing great.  She had a ultrasound today of her abdomen.  Her spleen is still about the same size.  Her spleen measures 317 cm.  She has had no problems with bowels or bladder.  She has had no bleeding.  She has had no cough or shortness of breath.  There is been no issues with COVID since we last saw her.  She has had no leg swelling.  There is been no rashes.  Overall, I would say performance status is probably ECOG 0.    Medications:  Current Outpatient Medications:    ALPRAZolam (XANAX) 1 MG tablet, Take 1 mg by mouth daily as needed., Disp: , Rfl:    clonazePAM (KLONOPIN) 1 MG tablet, Take 2 mg by mouth at bedtime., Disp: , Rfl:    COVID-19 mRNA bivalent vaccine, Moderna, (MODERNA COVID-19 BIVAL BOOSTER) 50 MCG/0.5ML injection, Inject into the muscle., Disp: 0.5 mL, Rfl: 0   gabapentin (NEURONTIN) 100 MG capsule, Take 300 mg by mouth at bedtime., Disp: , Rfl:    lamoTRIgine (LAMICTAL) 200 MG tablet, Take 200 mg by mouth daily., Disp: , Rfl:    linaclotide (LINZESS) 290 MCG CAPS capsule, Take 290 mcg by mouth daily before breakfast., Disp: , Rfl:    melatonin 5 MG TABS, 2 gummies, Disp: , Rfl:    meloxicam (MOBIC) 15 MG tablet, 1 tablet, Disp: , Rfl:    metoprolol succinate (TOPROL-XL) 25 MG 24 hr tablet, Take 1 tablet by mouth daily., Disp: , Rfl:    pantoprazole (PROTONIX) 20 MG tablet, Take by mouth., Disp: , Rfl:    pantoprazole (PROTONIX) 40 MG tablet, Take 40 mg by mouth daily., Disp: , Rfl:    QUEtiapine (SEROQUEL)  400 MG tablet, Take 400 mg by mouth at bedtime., Disp: , Rfl:   Allergies:  Allergies  Allergen Reactions   Minocycline Other (See Comments)    Fever, outbreak  Fever, outbreak     Past Medical History, Surgical history, Social history, and Family History were reviewed and updated.  Review of Systems: Review of Systems  Constitutional: Negative.   HENT:  Negative.    Eyes: Negative.   Respiratory: Negative.    Cardiovascular: Negative.   Gastrointestinal: Negative.   Endocrine: Negative.   Genitourinary: Negative.    Musculoskeletal: Negative.   Skin:  Positive for rash.  Neurological: Negative.   Hematological: Negative.   Psychiatric/Behavioral: Negative.     Physical Exam:  weight is 167 lb (75.8 kg). Her oral temperature is 98 F (36.7 C). Her blood pressure is 122/84 and her pulse is 73. Her respiration is 18 and oxygen saturation is 100%.   Wt Readings from Last 3 Encounters:  04/26/22 167 lb (75.8 kg)  09/04/21 170 lb 12.8 oz (77.5 kg)  07/20/21 165 lb (74.8 kg)    Physical Exam Vitals reviewed.  HENT:     Head: Normocephalic and atraumatic.  Eyes:     Pupils: Pupils are equal, round, and  reactive to light.  Cardiovascular:     Rate and Rhythm: Normal rate and regular rhythm.     Heart sounds: Normal heart sounds.  Pulmonary:     Effort: Pulmonary effort is normal.     Breath sounds: Normal breath sounds.  Abdominal:     General: Bowel sounds are normal.     Palpations: Abdomen is soft.  Musculoskeletal:        General: No tenderness or deformity. Normal range of motion.     Cervical back: Normal range of motion.  Lymphadenopathy:     Cervical: No cervical adenopathy.  Skin:    General: Skin is warm and dry.     Findings: No erythema or rash.  Neurological:     Mental Status: She is alert and oriented to person, place, and time.  Psychiatric:        Behavior: Behavior normal.        Thought Content: Thought content normal.        Judgment:  Judgment normal.     Lab Results  Component Value Date   WBC 6.2 04/26/2022   HGB 13.1 04/26/2022   HCT 38.6 04/26/2022   MCV 91.5 04/26/2022   PLT 290 04/26/2022     Chemistry      Component Value Date/Time   NA 140 04/26/2022 1421   K 3.7 04/26/2022 1421   CL 104 04/26/2022 1421   CO2 26 04/26/2022 1421   BUN 14 04/26/2022 1421   CREATININE 1.10 (H) 04/26/2022 1421   CREATININE 0.91 07/20/2021 1017      Component Value Date/Time   CALCIUM 10.4 (H) 04/26/2022 1421   ALKPHOS 58 04/26/2022 1421   AST 19 04/26/2022 1421   ALT 19 04/26/2022 1421   BILITOT 0.6 04/26/2022 1421      Impression and Plan: Catherine Fry is a very charming 56 year old white female.  She had mild, at best, splenomegaly.  Again, I do believe this can be idiopathic and not malignant in any way.  Her exam is unremarkable.  Her blood counts are all looking good.  I did look at her blood smear.  There is nothing on her blood smear that looked suggestive of an underlying myeloproliferative disorder.  I really think that we get her back in 1 year.  If all looks good in 1 year, that I think we can let her go from the practice.  We really are not doing much to help her medically.  However, she is a lot of fun to talk to.   Volanda Napoleon, MD 5/18/20233:39 PM

## 2022-04-27 ENCOUNTER — Ambulatory Visit: Payer: Medicare Other | Admitting: Hematology & Oncology

## 2022-04-27 ENCOUNTER — Other Ambulatory Visit (HOSPITAL_BASED_OUTPATIENT_CLINIC_OR_DEPARTMENT_OTHER): Payer: Medicare Other

## 2022-04-27 ENCOUNTER — Other Ambulatory Visit: Payer: Medicare Other

## 2022-04-27 DIAGNOSIS — I10 Essential (primary) hypertension: Secondary | ICD-10-CM | POA: Diagnosis not present

## 2022-04-27 LAB — KAPPA/LAMBDA LIGHT CHAINS
Kappa free light chain: 15.5 mg/L (ref 3.3–19.4)
Kappa, lambda light chain ratio: 1.76 — ABNORMAL HIGH (ref 0.26–1.65)
Lambda free light chains: 8.8 mg/L (ref 5.7–26.3)

## 2022-04-27 LAB — IGG, IGA, IGM
IgA: 106 mg/dL (ref 87–352)
IgG (Immunoglobin G), Serum: 663 mg/dL (ref 586–1602)
IgM (Immunoglobulin M), Srm: 30 mg/dL (ref 26–217)

## 2022-04-30 LAB — PROTEIN ELECTROPHORESIS, SERUM, WITH REFLEX
A/G Ratio: 1.6 (ref 0.7–1.7)
Albumin ELP: 4 g/dL (ref 2.9–4.4)
Alpha-1-Globulin: 0.3 g/dL (ref 0.0–0.4)
Alpha-2-Globulin: 0.6 g/dL (ref 0.4–1.0)
Beta Globulin: 1 g/dL (ref 0.7–1.3)
Gamma Globulin: 0.6 g/dL (ref 0.4–1.8)
Globulin, Total: 2.5 g/dL (ref 2.2–3.9)
Total Protein ELP: 6.5 g/dL (ref 6.0–8.5)

## 2022-05-23 ENCOUNTER — Encounter: Payer: Self-pay | Admitting: Hematology & Oncology

## 2022-05-24 DIAGNOSIS — M47816 Spondylosis without myelopathy or radiculopathy, lumbar region: Secondary | ICD-10-CM | POA: Diagnosis not present

## 2022-05-24 DIAGNOSIS — M791 Myalgia, unspecified site: Secondary | ICD-10-CM | POA: Diagnosis not present

## 2022-05-24 DIAGNOSIS — M5116 Intervertebral disc disorders with radiculopathy, lumbar region: Secondary | ICD-10-CM | POA: Diagnosis not present

## 2022-05-28 ENCOUNTER — Telehealth: Payer: Self-pay | Admitting: Hematology & Oncology

## 2022-05-28 NOTE — Telephone Encounter (Signed)
I left a message on Ms. Pinedo cell phone.  I talked to her about the lab work that she was concerned about.  I, myself, am not that worried about the lab work.  Nothing to me shows me that there is any significant hematological issue with respect to her enlarged spleen.  Everything really is typical for when we do see a slightly enlarged spleen.  I tried to reassure her that I just was not concerned.  Her lab work does not look bad with respect to her the important labs.  I told her if she has any questions she can always give the office a call and I will try to give her a call back.  Laurey Arrow

## 2022-05-29 ENCOUNTER — Encounter: Payer: Self-pay | Admitting: Hematology & Oncology

## 2022-06-25 ENCOUNTER — Encounter: Payer: Self-pay | Admitting: Dermatology

## 2022-06-25 ENCOUNTER — Ambulatory Visit: Payer: Medicare PPO | Admitting: Dermatology

## 2022-06-25 DIAGNOSIS — Z1283 Encounter for screening for malignant neoplasm of skin: Secondary | ICD-10-CM | POA: Diagnosis not present

## 2022-06-25 DIAGNOSIS — L814 Other melanin hyperpigmentation: Secondary | ICD-10-CM

## 2022-06-25 DIAGNOSIS — L309 Dermatitis, unspecified: Secondary | ICD-10-CM | POA: Diagnosis not present

## 2022-06-25 MED ORDER — TACROLIMUS 0.1 % EX OINT
TOPICAL_OINTMENT | Freq: Two times a day (BID) | CUTANEOUS | 8 refills | Status: DC
Start: 2022-06-25 — End: 2023-04-26

## 2022-07-16 ENCOUNTER — Encounter: Payer: Self-pay | Admitting: Dermatology

## 2022-07-16 NOTE — Progress Notes (Signed)
   Follow-Up Visit   Subjective  Catherine Fry is a 56 y.o. female who presents for the following: Annual Exam (Right upper arm- "dark lesion" & around nose- having some issue again- perioral dermatitis in past).  Skin check, dark spot on right upper arm, possible recurrent perioral dermatitis. Location:  Duration:  Quality:  Associated Signs/Symptoms: Modifying Factors:  Severity:  Timing: Context:   Objective  Well appearing patient in no apparent distress; mood and affect are within normal limits. Full body skin examination: No atypical pigmented lesions (all checked with dermoscopy), no nonmelanoma skin cancer.  Left Alar Crease, Right Alar Crease Clinically she relates a history of possible perioral dermatitis, recurrent slightly inflamed pink scale better fits seborrheic dermatitis.  Left Upper Arm - Posterior Brown 3 mm symmetric macule, no dermoscopic atypia    A full examination was performed including scalp, head, eyes, ears, nose, lips, neck, chest, axillae, abdomen, back, buttocks, bilateral upper extremities, bilateral lower extremities, hands, feet, fingers, toes, fingernails, and toenails. All findings within normal limits unless otherwise noted below.  Areas beneath undergarments not fully examined.   Assessment & Plan    Dermatitis Left Alar Crease; Right Alar Crease  In view of the history of possible perioral dermatitis I suggested we avoid initially using a topical cortisone.  Prescription given for tacrolimus ointment to be applied nightly for 3 to 4 weeks, taper if improves.  tacrolimus (PROTOPIC) 0.1 % ointment - Left Alar Crease, Right Alar Crease Apply topically 2 (two) times daily.  Encounter for screening for malignant neoplasm of skin  Lentigines Left Upper Arm - Posterior  Recheck if there is change in size, shape, color      I, Lavonna Monarch, MD, have reviewed all documentation for this visit.  The documentation on 07/16/22 for the  exam, diagnosis, procedures, and orders are all accurate and complete.

## 2022-07-26 DIAGNOSIS — M7062 Trochanteric bursitis, left hip: Secondary | ICD-10-CM | POA: Diagnosis not present

## 2022-07-26 DIAGNOSIS — M47816 Spondylosis without myelopathy or radiculopathy, lumbar region: Secondary | ICD-10-CM | POA: Diagnosis not present

## 2022-08-14 DIAGNOSIS — M25559 Pain in unspecified hip: Secondary | ICD-10-CM | POA: Diagnosis not present

## 2022-08-14 DIAGNOSIS — M6281 Muscle weakness (generalized): Secondary | ICD-10-CM | POA: Diagnosis not present

## 2022-08-16 DIAGNOSIS — M25559 Pain in unspecified hip: Secondary | ICD-10-CM | POA: Diagnosis not present

## 2022-08-16 DIAGNOSIS — M6281 Muscle weakness (generalized): Secondary | ICD-10-CM | POA: Diagnosis not present

## 2022-08-21 DIAGNOSIS — M6281 Muscle weakness (generalized): Secondary | ICD-10-CM | POA: Diagnosis not present

## 2022-08-21 DIAGNOSIS — M25559 Pain in unspecified hip: Secondary | ICD-10-CM | POA: Diagnosis not present

## 2022-08-23 DIAGNOSIS — M6281 Muscle weakness (generalized): Secondary | ICD-10-CM | POA: Diagnosis not present

## 2022-08-23 DIAGNOSIS — M25559 Pain in unspecified hip: Secondary | ICD-10-CM | POA: Diagnosis not present

## 2022-08-24 IMAGING — US US ABDOMEN LIMITED
1 series · 14 of 21 positions shown · non-contrast
Comparison: None.

CLINICAL DATA: Assess for splenomegaly. Please make sure spleen
measurement is noted.

EXAM:
ULTRASOUND ABDOMEN LIMITED

[Series 1: us abdomen limited · 21 acquisitions, 14 frames shown]
[im 1/21]
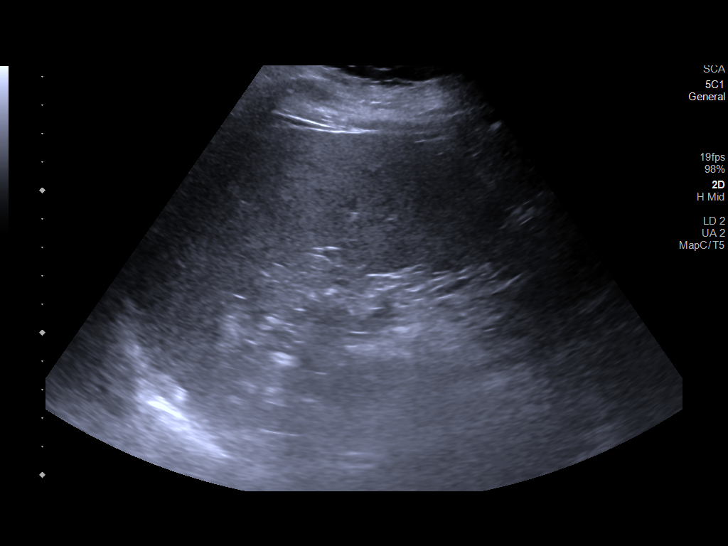
[im 3/21]
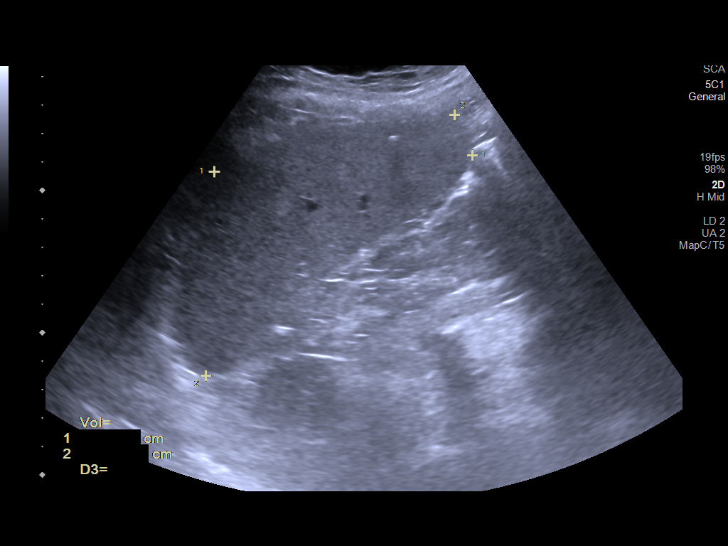
[im 4/21]
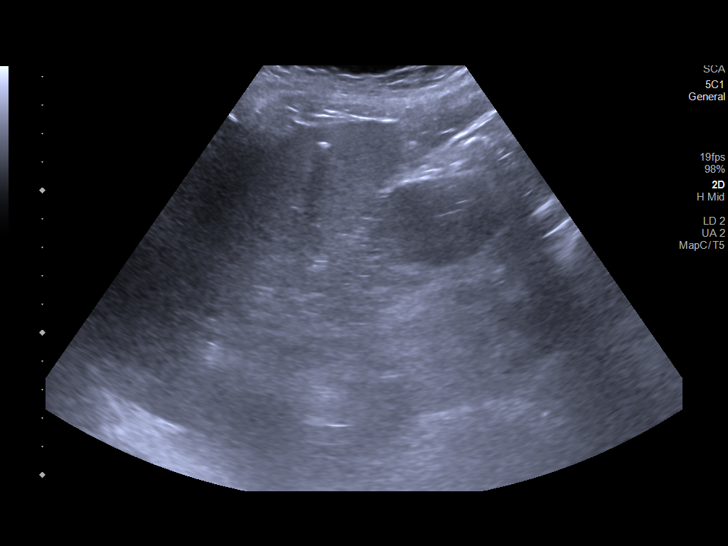
[im 6/21]
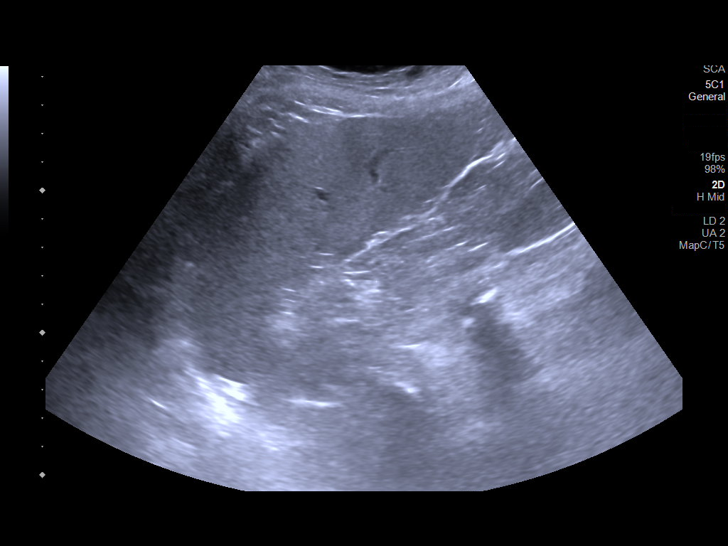
[im 7/21]
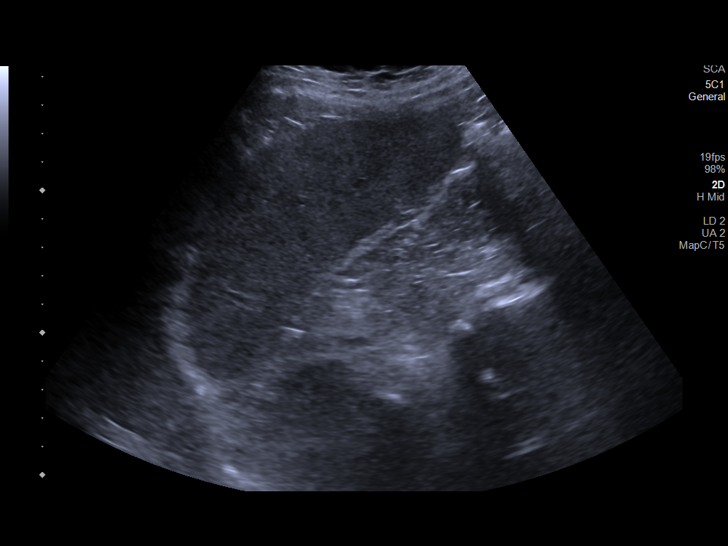
[im 9/21]
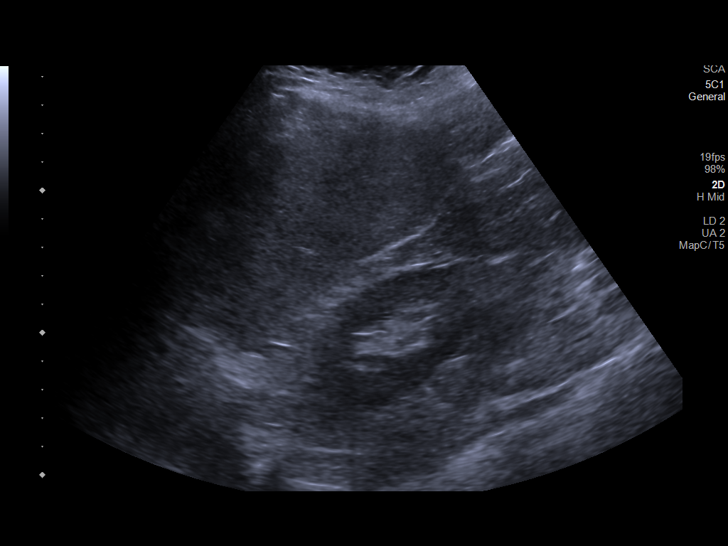
[im 10/21]
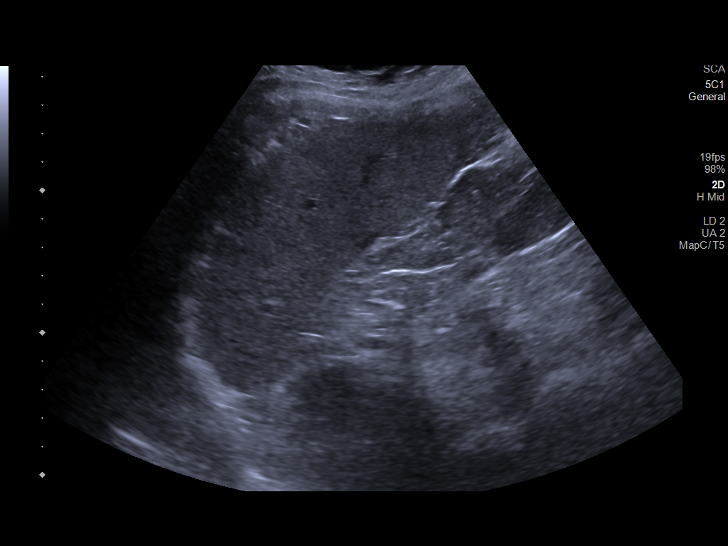
[im 12/21]
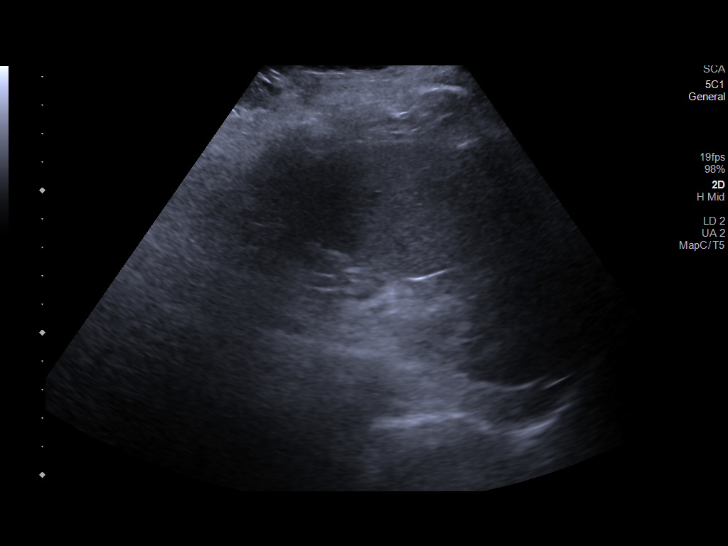
[im 13/21]
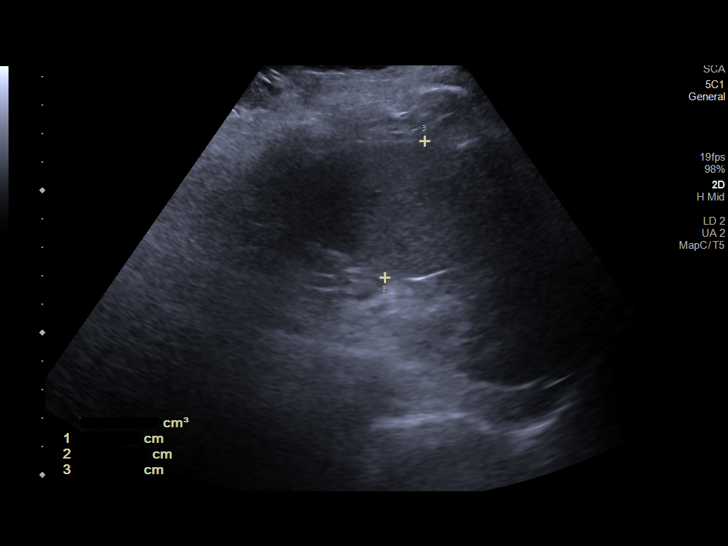
[im 15/21]
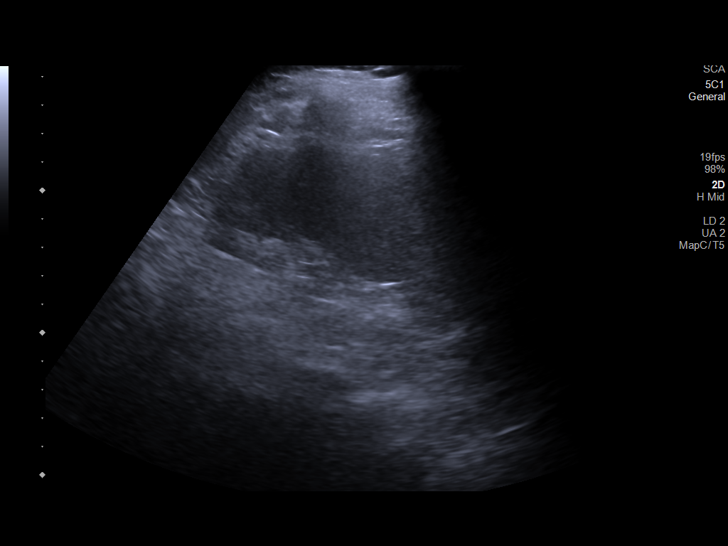
[im 16/21]
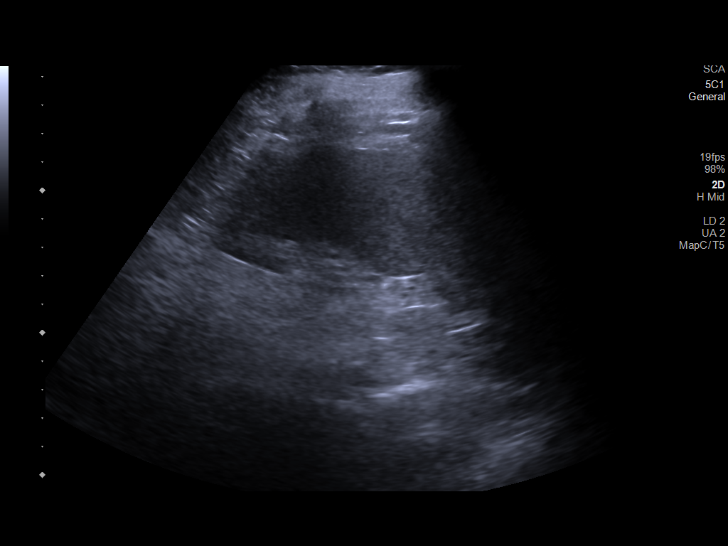
[im 18/21]
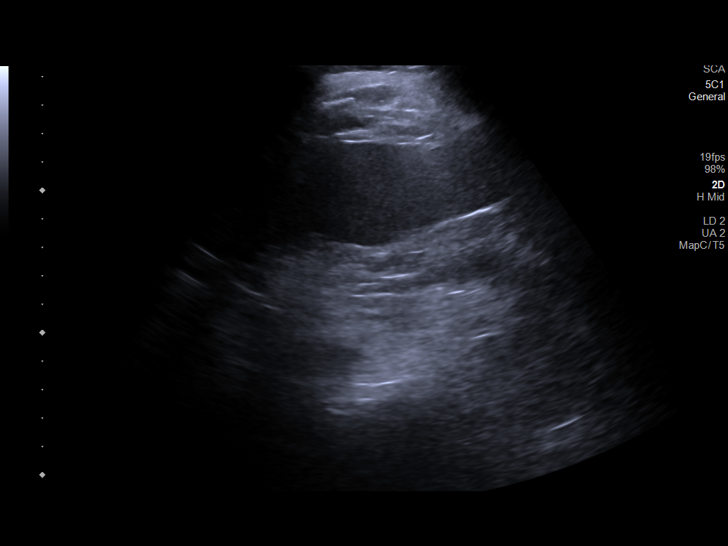
[im 19/21]
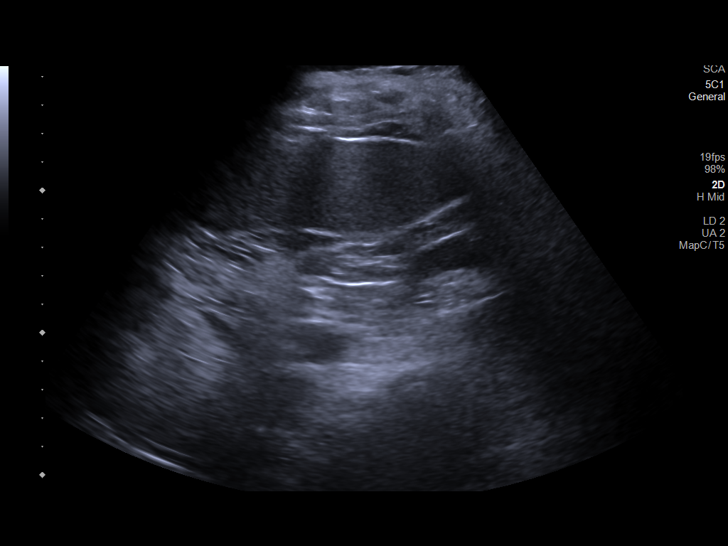
[im 21/21]
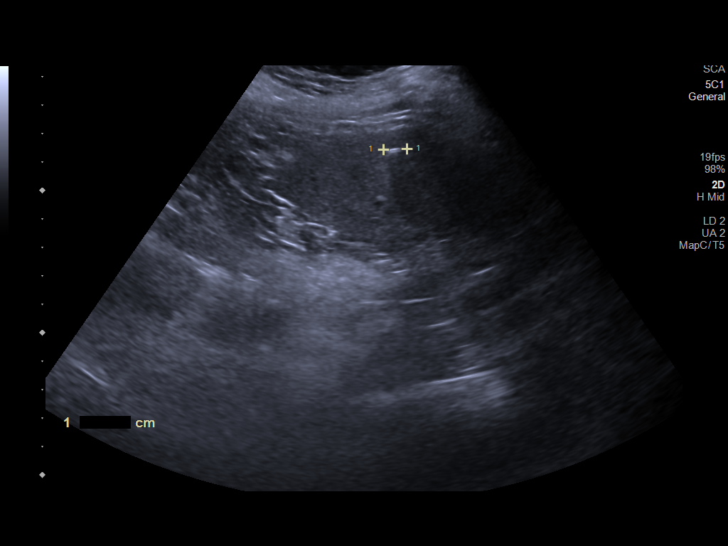

[14 of 21 positions shown; findings below may reference images not displayed]

FINDINGS: No focal mass. Spleen measures 9.1 x 12.7 x 5 cm with a volume of
301.5 ml.
IMPRESSION: Mild splenomegaly.

## 2022-09-04 DIAGNOSIS — M6281 Muscle weakness (generalized): Secondary | ICD-10-CM | POA: Diagnosis not present

## 2022-09-04 DIAGNOSIS — M25559 Pain in unspecified hip: Secondary | ICD-10-CM | POA: Diagnosis not present

## 2022-09-10 DIAGNOSIS — Z86010 Personal history of colonic polyps: Secondary | ICD-10-CM | POA: Diagnosis not present

## 2022-09-10 DIAGNOSIS — K5730 Diverticulosis of large intestine without perforation or abscess without bleeding: Secondary | ICD-10-CM | POA: Diagnosis not present

## 2022-09-10 DIAGNOSIS — K648 Other hemorrhoids: Secondary | ICD-10-CM | POA: Diagnosis not present

## 2022-09-10 DIAGNOSIS — Z09 Encounter for follow-up examination after completed treatment for conditions other than malignant neoplasm: Secondary | ICD-10-CM | POA: Diagnosis not present

## 2022-09-20 ENCOUNTER — Other Ambulatory Visit (HOSPITAL_BASED_OUTPATIENT_CLINIC_OR_DEPARTMENT_OTHER): Payer: Self-pay

## 2022-09-20 MED ORDER — COMIRNATY 30 MCG/0.3ML IM SUSP
INTRAMUSCULAR | 0 refills | Status: DC
  Filled 2022-09-20: qty 0.3, 1d supply, fill #0

## 2022-11-21 ENCOUNTER — Telehealth: Payer: Self-pay

## 2022-11-21 NOTE — Telephone Encounter (Signed)
Received phone call from patient with multiple questions and complaints and asking for guidance. Pt stated she has had swollen lymph nodes in her armpits for the past 3 years along with leg pain. Pt states she has seen her orthopedic doctor for the leg pain and has had multiple MRIs. Pt stating she sees her PCP next week and feels that she never gets any answers for her problems. This RN advised pt to see PCP and to have PCP reach out if there were any concerns.Pt also stated she sees her orthopedic MD 11/22/2022. Pt asking if she needs a pain clinic or autoimmune workup. Pt educated that this RN is unable to advise her on that and that her PCP would be able to give her more information based on her symptoms and physical exam.  Pt tearful and frustrated stating that she is trying to advocate and get answers. Pt offered verbal comfort. Pt aware that if her PCP or other providers have any concerns to reach out and we can see her as quickly as possible. Pt aware and verbalized understanding.

## 2022-11-22 DIAGNOSIS — M7061 Trochanteric bursitis, right hip: Secondary | ICD-10-CM | POA: Diagnosis not present

## 2022-11-22 DIAGNOSIS — M7062 Trochanteric bursitis, left hip: Secondary | ICD-10-CM | POA: Diagnosis not present

## 2022-11-22 DIAGNOSIS — M47816 Spondylosis without myelopathy or radiculopathy, lumbar region: Secondary | ICD-10-CM | POA: Diagnosis not present

## 2022-12-07 DIAGNOSIS — M25552 Pain in left hip: Secondary | ICD-10-CM | POA: Diagnosis not present

## 2022-12-07 DIAGNOSIS — M7061 Trochanteric bursitis, right hip: Secondary | ICD-10-CM | POA: Diagnosis not present

## 2022-12-07 DIAGNOSIS — Z6829 Body mass index (BMI) 29.0-29.9, adult: Secondary | ICD-10-CM | POA: Diagnosis not present

## 2022-12-07 DIAGNOSIS — M7062 Trochanteric bursitis, left hip: Secondary | ICD-10-CM | POA: Diagnosis not present

## 2022-12-07 DIAGNOSIS — M25551 Pain in right hip: Secondary | ICD-10-CM | POA: Diagnosis not present

## 2022-12-19 DIAGNOSIS — N6489 Other specified disorders of breast: Secondary | ICD-10-CM | POA: Diagnosis not present

## 2022-12-19 DIAGNOSIS — R928 Other abnormal and inconclusive findings on diagnostic imaging of breast: Secondary | ICD-10-CM | POA: Diagnosis not present

## 2022-12-20 DIAGNOSIS — M25551 Pain in right hip: Secondary | ICD-10-CM | POA: Diagnosis not present

## 2022-12-20 DIAGNOSIS — M79605 Pain in left leg: Secondary | ICD-10-CM | POA: Diagnosis not present

## 2022-12-20 DIAGNOSIS — M25552 Pain in left hip: Secondary | ICD-10-CM | POA: Diagnosis not present

## 2022-12-20 DIAGNOSIS — M6281 Muscle weakness (generalized): Secondary | ICD-10-CM | POA: Diagnosis not present

## 2022-12-27 DIAGNOSIS — N9489 Other specified conditions associated with female genital organs and menstrual cycle: Secondary | ICD-10-CM | POA: Diagnosis not present

## 2022-12-28 DIAGNOSIS — M6281 Muscle weakness (generalized): Secondary | ICD-10-CM | POA: Diagnosis not present

## 2022-12-28 DIAGNOSIS — M25552 Pain in left hip: Secondary | ICD-10-CM | POA: Diagnosis not present

## 2022-12-28 DIAGNOSIS — M25551 Pain in right hip: Secondary | ICD-10-CM | POA: Diagnosis not present

## 2022-12-28 DIAGNOSIS — M79605 Pain in left leg: Secondary | ICD-10-CM | POA: Diagnosis not present

## 2023-01-01 DIAGNOSIS — M79605 Pain in left leg: Secondary | ICD-10-CM | POA: Diagnosis not present

## 2023-01-01 DIAGNOSIS — M25552 Pain in left hip: Secondary | ICD-10-CM | POA: Diagnosis not present

## 2023-01-01 DIAGNOSIS — M6281 Muscle weakness (generalized): Secondary | ICD-10-CM | POA: Diagnosis not present

## 2023-01-01 DIAGNOSIS — M25551 Pain in right hip: Secondary | ICD-10-CM | POA: Diagnosis not present

## 2023-01-02 ENCOUNTER — Ambulatory Visit: Payer: Medicare PPO | Admitting: Family Medicine

## 2023-01-02 DIAGNOSIS — M7061 Trochanteric bursitis, right hip: Secondary | ICD-10-CM | POA: Diagnosis not present

## 2023-01-02 DIAGNOSIS — M7062 Trochanteric bursitis, left hip: Secondary | ICD-10-CM | POA: Diagnosis not present

## 2023-01-03 DIAGNOSIS — M6281 Muscle weakness (generalized): Secondary | ICD-10-CM | POA: Diagnosis not present

## 2023-01-03 DIAGNOSIS — M79605 Pain in left leg: Secondary | ICD-10-CM | POA: Diagnosis not present

## 2023-01-03 DIAGNOSIS — M25551 Pain in right hip: Secondary | ICD-10-CM | POA: Diagnosis not present

## 2023-01-03 DIAGNOSIS — M25552 Pain in left hip: Secondary | ICD-10-CM | POA: Diagnosis not present

## 2023-01-07 DIAGNOSIS — M6281 Muscle weakness (generalized): Secondary | ICD-10-CM | POA: Diagnosis not present

## 2023-01-07 DIAGNOSIS — M79605 Pain in left leg: Secondary | ICD-10-CM | POA: Diagnosis not present

## 2023-01-07 DIAGNOSIS — M25552 Pain in left hip: Secondary | ICD-10-CM | POA: Diagnosis not present

## 2023-01-07 DIAGNOSIS — M25551 Pain in right hip: Secondary | ICD-10-CM | POA: Diagnosis not present

## 2023-01-10 DIAGNOSIS — M25551 Pain in right hip: Secondary | ICD-10-CM | POA: Diagnosis not present

## 2023-01-10 DIAGNOSIS — M25552 Pain in left hip: Secondary | ICD-10-CM | POA: Diagnosis not present

## 2023-01-10 DIAGNOSIS — M6281 Muscle weakness (generalized): Secondary | ICD-10-CM | POA: Diagnosis not present

## 2023-01-10 DIAGNOSIS — M79605 Pain in left leg: Secondary | ICD-10-CM | POA: Diagnosis not present

## 2023-01-14 DIAGNOSIS — M25551 Pain in right hip: Secondary | ICD-10-CM | POA: Diagnosis not present

## 2023-01-14 DIAGNOSIS — M79605 Pain in left leg: Secondary | ICD-10-CM | POA: Diagnosis not present

## 2023-01-14 DIAGNOSIS — M6281 Muscle weakness (generalized): Secondary | ICD-10-CM | POA: Diagnosis not present

## 2023-01-14 DIAGNOSIS — M25552 Pain in left hip: Secondary | ICD-10-CM | POA: Diagnosis not present

## 2023-01-17 DIAGNOSIS — M25551 Pain in right hip: Secondary | ICD-10-CM | POA: Diagnosis not present

## 2023-01-17 DIAGNOSIS — M79605 Pain in left leg: Secondary | ICD-10-CM | POA: Diagnosis not present

## 2023-01-17 DIAGNOSIS — M25552 Pain in left hip: Secondary | ICD-10-CM | POA: Diagnosis not present

## 2023-01-17 DIAGNOSIS — M6281 Muscle weakness (generalized): Secondary | ICD-10-CM | POA: Diagnosis not present

## 2023-01-21 DIAGNOSIS — M25551 Pain in right hip: Secondary | ICD-10-CM | POA: Diagnosis not present

## 2023-01-21 DIAGNOSIS — M6281 Muscle weakness (generalized): Secondary | ICD-10-CM | POA: Diagnosis not present

## 2023-01-21 DIAGNOSIS — M25552 Pain in left hip: Secondary | ICD-10-CM | POA: Diagnosis not present

## 2023-01-21 DIAGNOSIS — M79605 Pain in left leg: Secondary | ICD-10-CM | POA: Diagnosis not present

## 2023-01-24 DIAGNOSIS — M79605 Pain in left leg: Secondary | ICD-10-CM | POA: Diagnosis not present

## 2023-01-24 DIAGNOSIS — M6281 Muscle weakness (generalized): Secondary | ICD-10-CM | POA: Diagnosis not present

## 2023-01-24 DIAGNOSIS — M25552 Pain in left hip: Secondary | ICD-10-CM | POA: Diagnosis not present

## 2023-01-24 DIAGNOSIS — M25551 Pain in right hip: Secondary | ICD-10-CM | POA: Diagnosis not present

## 2023-01-28 DIAGNOSIS — M25552 Pain in left hip: Secondary | ICD-10-CM | POA: Diagnosis not present

## 2023-01-28 DIAGNOSIS — M25551 Pain in right hip: Secondary | ICD-10-CM | POA: Diagnosis not present

## 2023-01-28 DIAGNOSIS — M79605 Pain in left leg: Secondary | ICD-10-CM | POA: Diagnosis not present

## 2023-01-28 DIAGNOSIS — M6281 Muscle weakness (generalized): Secondary | ICD-10-CM | POA: Diagnosis not present

## 2023-01-29 DIAGNOSIS — L710 Perioral dermatitis: Secondary | ICD-10-CM | POA: Diagnosis not present

## 2023-01-30 DIAGNOSIS — M79605 Pain in left leg: Secondary | ICD-10-CM | POA: Diagnosis not present

## 2023-01-30 DIAGNOSIS — M6281 Muscle weakness (generalized): Secondary | ICD-10-CM | POA: Diagnosis not present

## 2023-01-30 DIAGNOSIS — M25552 Pain in left hip: Secondary | ICD-10-CM | POA: Diagnosis not present

## 2023-01-30 DIAGNOSIS — M25551 Pain in right hip: Secondary | ICD-10-CM | POA: Diagnosis not present

## 2023-02-19 DIAGNOSIS — M7061 Trochanteric bursitis, right hip: Secondary | ICD-10-CM | POA: Diagnosis not present

## 2023-02-19 DIAGNOSIS — M7062 Trochanteric bursitis, left hip: Secondary | ICD-10-CM | POA: Diagnosis not present

## 2023-02-28 DIAGNOSIS — F418 Other specified anxiety disorders: Secondary | ICD-10-CM | POA: Diagnosis not present

## 2023-02-28 DIAGNOSIS — Z0000 Encounter for general adult medical examination without abnormal findings: Secondary | ICD-10-CM | POA: Diagnosis not present

## 2023-02-28 DIAGNOSIS — E7800 Pure hypercholesterolemia, unspecified: Secondary | ICD-10-CM | POA: Diagnosis not present

## 2023-02-28 DIAGNOSIS — Z6828 Body mass index (BMI) 28.0-28.9, adult: Secondary | ICD-10-CM | POA: Diagnosis not present

## 2023-02-28 DIAGNOSIS — K219 Gastro-esophageal reflux disease without esophagitis: Secondary | ICD-10-CM | POA: Diagnosis not present

## 2023-02-28 DIAGNOSIS — Q8909 Congenital malformations of spleen: Secondary | ICD-10-CM | POA: Diagnosis not present

## 2023-02-28 DIAGNOSIS — K589 Irritable bowel syndrome without diarrhea: Secondary | ICD-10-CM | POA: Diagnosis not present

## 2023-02-28 DIAGNOSIS — R413 Other amnesia: Secondary | ICD-10-CM | POA: Diagnosis not present

## 2023-03-01 DIAGNOSIS — M7062 Trochanteric bursitis, left hip: Secondary | ICD-10-CM | POA: Diagnosis not present

## 2023-03-01 DIAGNOSIS — M7061 Trochanteric bursitis, right hip: Secondary | ICD-10-CM | POA: Diagnosis not present

## 2023-03-18 ENCOUNTER — Ambulatory Visit
Admission: EM | Admit: 2023-03-18 | Discharge: 2023-03-18 | Disposition: A | Discharge status: Home / Self Care | Payer: Medicare PPO | Attending: Family Medicine | Admitting: Family Medicine

## 2023-03-18 ENCOUNTER — Ambulatory Visit (INDEPENDENT_AMBULATORY_CARE_PROVIDER_SITE_OTHER): Payer: Medicare PPO

## 2023-03-18 DIAGNOSIS — K5909 Other constipation: Secondary | ICD-10-CM

## 2023-03-18 DIAGNOSIS — K581 Irritable bowel syndrome with constipation: Secondary | ICD-10-CM

## 2023-03-18 DIAGNOSIS — K5900 Constipation, unspecified: Secondary | ICD-10-CM

## 2023-03-18 NOTE — ED Triage Notes (Signed)
Pt c/o constipation x 7 days. Hx of IBS with constipation. Called GI doc this am, referred to UC for possible xray. Magnesium citrate 9am, a small amount of loose stool last night. Takes linzess.

## 2023-03-18 NOTE — ED Provider Notes (Signed)
[facility]    CSN: [id number] Arrival date & time: 03/18/23  0909      History   Chief Complaint Chief Complaint  Patient presents with   Constipation    HPI Catherine Fry is a 57 y.o. female.   HPI  Patient states she has a long history of IBS with constipation.  She takes Linzess.  She still has difficulty with her bowels.  She states currently she has not had a normal bowel movement for about 7 days.  With mag citrate she had a small amount of soft stool.  She is not really having any pain.  She does feel bloating and distended.  She called her GI doctor this morning.  They were unable to see her.  They recommended an ER or urgent care visit for an x-ray.  Past Medical History:  Diagnosis Date   Atypical mole    moderte right upper buttock   Insomnia    Tachycardia     There are no problems to display for this patient.   Past Surgical History:  Procedure Laterality Date   OOPHORECTOMY Right    TONSILLECTOMY      OB History   No obstetric history on file.      Home Medications    Prior to Admission medications   Medication Sig Start Date End Date Taking? Authorizing Provider  ALPRAZolam Prudy Feeler) 1 MG tablet Take 1 mg by mouth daily as needed. 03/13/21   [provider]  clonazePAM (KLONOPIN) 1 MG tablet Take 2 mg by mouth at bedtime.    [provider]  COVID-19 mRNA bivalent vaccine, Moderna, (MODERNA COVID-19 BIVAL BOOSTER) 50 MCG/0.5ML injection Inject into the muscle. 10/03/21   Judyann Munson, MD  COVID-19 mRNA Vac-TriS, Pfizer, (COMIRNATY) SUSP injection Inject into the muscle. 09/20/22   Judyann Munson, MD  gabapentin (NEURONTIN) 100 MG capsule Take 300 mg by mouth at bedtime. 12/02/20   [provider]  lamoTRIgine (LAMICTAL) 200 MG tablet Take 200 mg by mouth daily.    [provider]  linaclotide (LINZESS) 290 MCG CAPS capsule Take 290 mcg by mouth daily before breakfast.    [provider]   melatonin 5 MG TABS 2 gummies    [provider]  meloxicam (MOBIC) 15 MG tablet 1 tablet    [provider]  metoprolol succinate (TOPROL-XL) 25 MG 24 hr tablet Take 1 tablet by mouth daily. 04/25/21   [provider]  pantoprazole (PROTONIX) 20 MG tablet Take by mouth.    [provider]  pantoprazole (PROTONIX) 40 MG tablet Take 40 mg by mouth daily. 10/18/20   [provider]  QUEtiapine (SEROQUEL) 400 MG tablet Take 400 mg by mouth at bedtime.    [provider]  tacrolimus (PROTOPIC) 0.1 % ointment Apply topically 2 (two) times daily. 06/25/22   Janalyn Harder, MD    Family History Family History  Problem Relation Age of Onset   Cancer Mother        breast   Lymphoma Father    Alzheimer's disease Father     Social History Social History   Tobacco Use   Smoking status: Never   Smokeless tobacco: Never  Vaping Use   Vaping Use: Never used  Substance Use Topics   Alcohol use: Yes   Drug use: No     Allergies   Minocycline   Review of Systems Review of Systems See HPI  Physical Exam Triage Vital Signs ED Triage  Vitals  Enc Vitals Group     BP 03/18/23 0920 (!) 150/86     Pulse Rate 03/18/23 0920 70     Resp 03/18/23 0920 17     Temp 03/18/23 0920 97.7 F (36.5 C)     Temp Source 03/18/23 0920 Oral     SpO2 03/18/23 0920 98 %     Weight --      Height --      Head Circumference --      Peak Flow --      Pain Score 03/18/23 0924 1     Pain Loc --      Pain Edu? --      Excl. in GC? --    No data found.  Updated Vital Signs BP (!) 150/86 (BP Location: Right Arm)   Pulse 70   Temp 97.7 F (36.5 C) (Oral)   Resp 17   SpO2 98%   Physical Exam Constitutional:      General: She is not in acute distress.    Appearance: She is well-developed.  HENT:     Head: Normocephalic and atraumatic.  Eyes:     Conjunctiva/sclera: Conjunctivae normal.     Pupils: Pupils are equal, round, and reactive to  light.  Cardiovascular:     Rate and Rhythm: Normal rate.  Pulmonary:     Effort: Pulmonary effort is normal. No respiratory distress.  Abdominal:     General: Bowel sounds are normal. There is no distension.     Palpations: Abdomen is soft.     Comments: Rounded abdomen.  Soft and nontender  Musculoskeletal:        General: Normal range of motion.     Cervical back: Normal range of motion.  Skin:    General: Skin is warm and dry.  Neurological:     Mental Status: She is alert.      UC Treatments / Results  Labs (all labs ordered are listed, but only abnormal results are displayed) Labs Reviewed - No data to display  EKG   Radiology DG Abdomen 1 View  Result Date: 03/18/2023 CLINICAL DATA:  Constipation. EXAM: ABDOMEN - 1 VIEW COMPARISON:  04/25/2020. FINDINGS: Gas and stool are seen in the colon. No small bowel dilatation. No unexpected radiopaque calculi. Phleboliths in the anatomic pelvis. Degenerative changes in the spine. Calcified granuloma in the lingula. Lung bases are otherwise clear. IMPRESSION: No acute findings. Electronically Signed   By: Leanna BattlesMelinda  Blietz M.D.   On: 03/18/2023 10:07    Procedures Procedures (including critical care time)  Medications Ordered in UC Medications - No data to display  Initial Impression / Assessment and Plan / UC Course  I have reviewed the triage vital signs and the nursing notes.  Pertinent labs & imaging results that were available during my care of the patient were reviewed by me and considered in my medical decision making (see chart for details).     Discussed management of constipation.  In general fiber, fluids, activity are important.  At this point if the Linzess and mag citrate are not working she should consider getting an fleets enema Final Clinical Impressions(s) / UC Diagnoses   Final diagnoses:  Irritable bowel syndrome with constipation  Other constipation     Discharge Instructions      Make sure that  you are drinking lots of water Physical activity and walking helps the bowels move If the medications you have taken do not work, try a suppository or  enema Call your GI doctor if you fail to improve     ED Prescriptions   None    PDMP not reviewed this encounter.   Eustace Moore, MD 03/18/23 [phone]

## 2023-03-18 NOTE — Discharge Instructions (Addendum)
Make sure that you are drinking lots of water Physical activity and walking helps the bowels move If the medications you have taken do not work, try a suppository or enema Call your GI doctor if you fail to improve

## 2023-04-01 DIAGNOSIS — M7061 Trochanteric bursitis, right hip: Secondary | ICD-10-CM | POA: Diagnosis not present

## 2023-04-01 DIAGNOSIS — M7062 Trochanteric bursitis, left hip: Secondary | ICD-10-CM | POA: Diagnosis not present

## 2023-04-02 DIAGNOSIS — L814 Other melanin hyperpigmentation: Secondary | ICD-10-CM | POA: Diagnosis not present

## 2023-04-02 DIAGNOSIS — L988 Other specified disorders of the skin and subcutaneous tissue: Secondary | ICD-10-CM | POA: Diagnosis not present

## 2023-04-02 DIAGNOSIS — L719 Rosacea, unspecified: Secondary | ICD-10-CM | POA: Diagnosis not present

## 2023-04-03 DIAGNOSIS — E7800 Pure hypercholesterolemia, unspecified: Secondary | ICD-10-CM | POA: Diagnosis not present

## 2023-04-03 DIAGNOSIS — M2550 Pain in unspecified joint: Secondary | ICD-10-CM | POA: Diagnosis not present

## 2023-04-03 DIAGNOSIS — Z6827 Body mass index (BMI) 27.0-27.9, adult: Secondary | ICD-10-CM | POA: Diagnosis not present

## 2023-04-03 DIAGNOSIS — R030 Elevated blood-pressure reading, without diagnosis of hypertension: Secondary | ICD-10-CM | POA: Diagnosis not present

## 2023-04-18 DIAGNOSIS — M7061 Trochanteric bursitis, right hip: Secondary | ICD-10-CM | POA: Diagnosis not present

## 2023-04-18 DIAGNOSIS — M7062 Trochanteric bursitis, left hip: Secondary | ICD-10-CM | POA: Diagnosis not present

## 2023-04-26 ENCOUNTER — Other Ambulatory Visit: Payer: Self-pay

## 2023-04-26 ENCOUNTER — Ambulatory Visit (HOSPITAL_BASED_OUTPATIENT_CLINIC_OR_DEPARTMENT_OTHER)
Admission: RE | Admit: 2023-04-26 | Discharge: 2023-04-26 | Disposition: A | Discharge status: Home / Self Care | Payer: Medicare PPO | Source: Ambulatory Visit | Attending: Hematology & Oncology | Admitting: Hematology & Oncology

## 2023-04-26 ENCOUNTER — Inpatient Hospital Stay: Payer: Medicare PPO | Admitting: Hematology & Oncology

## 2023-04-26 ENCOUNTER — Encounter: Payer: Self-pay | Admitting: Hematology & Oncology

## 2023-04-26 ENCOUNTER — Inpatient Hospital Stay: Payer: Medicare PPO | Attending: Hematology & Oncology

## 2023-04-26 VITALS — BP 136/84 | HR 72 | Temp 98.2°F | Resp 18 | Ht 64.0 in | Wt 152.1 lb

## 2023-04-26 DIAGNOSIS — R161 Splenomegaly, not elsewhere classified: Secondary | ICD-10-CM | POA: Insufficient documentation

## 2023-04-26 DIAGNOSIS — Z87898 Personal history of other specified conditions: Secondary | ICD-10-CM | POA: Diagnosis not present

## 2023-04-26 DIAGNOSIS — Z79899 Other long term (current) drug therapy: Secondary | ICD-10-CM | POA: Insufficient documentation

## 2023-04-26 LAB — CMP (CANCER CENTER ONLY)
ALT: 11 U/L (ref 0–44)
AST: 14 U/L — ABNORMAL LOW (ref 15–41)
Albumin: 4.4 g/dL (ref 3.5–5.0)
Alkaline Phosphatase: 61 U/L (ref 38–126)
Anion gap: 6 (ref 5–15)
BUN: 15 mg/dL (ref 6–20)
CO2: 29 mmol/L (ref 22–32)
Calcium: 10.5 mg/dL — ABNORMAL HIGH (ref 8.9–10.3)
Chloride: 106 mmol/L (ref 98–111)
Creatinine: 0.97 mg/dL (ref 0.44–1.00)
GFR, Estimated: >60 mL/min (ref >60)
Glucose, Bld: 92 mg/dL (ref 70–99)
Potassium: 4.1 mmol/L (ref 3.5–5.1)
Sodium: 141 mmol/L (ref 135–145)
Total Bilirubin: 0.5 mg/dL (ref 0.3–1.2)
Total Protein: 6.6 g/dL (ref 6.5–8.1)

## 2023-04-26 LAB — CBC WITH DIFFERENTIAL (CANCER CENTER ONLY)
Abs Immature Granulocytes: 0.02 10*3/uL (ref 0.00–0.07)
Basophils Absolute: 0.1 10*3/uL (ref 0.0–0.1)
Basophils Relative: 2 %
Eosinophils Absolute: 0.2 10*3/uL (ref 0.0–0.5)
Eosinophils Relative: 4 %
HCT: 39.1 % (ref 36.0–46.0)
Hemoglobin: 13 g/dL (ref 12.0–15.0)
Immature Granulocytes: 0 %
Lymphocytes Relative: 29 %
Lymphs Abs: 1.5 10*3/uL (ref 0.7–4.0)
MCH: 30.3 pg (ref 26.0–34.0)
MCHC: 33.2 g/dL (ref 30.0–36.0)
MCV: 91.1 fL (ref 80.0–100.0)
Monocytes Absolute: 0.4 10*3/uL (ref 0.1–1.0)
Monocytes Relative: 8 %
Neutro Abs: 3 10*3/uL (ref 1.7–7.7)
Neutrophils Relative %: 57 %
Platelet Count: 288 10*3/uL (ref 150–400)
RBC: 4.29 MIL/uL (ref 3.87–5.11)
RDW: 13 % (ref 11.5–15.5)
WBC Count: 5.3 10*3/uL (ref 4.0–10.5)
nRBC: 0 % (ref 0.0–0.2)

## 2023-04-26 LAB — LACTATE DEHYDROGENASE: LDH: 173 U/L (ref 98–192)

## 2023-04-26 NOTE — Progress Notes (Signed)
Hematology and Oncology Follow Up Visit  Catherine Fry [id number] [birth year] 58 y.o. 04/26/2023   Principle Diagnosis:  Splenomegaly-mild, likely idiopathic  Current Therapy:   Observation     Interim History:  Catherine Fry is back for a follow-up.  She is doing pretty well.  She is lost weight.  She is trying to lose weight.  She is watching her diet.  I am very proud of her.  She is lost about 13 pounds since we last saw her.  She had a splenic ultrasound today.  Thankfully, this showed that her spleen is decreased in size.  Its volume is now 161 cm..  She is having some pain in the right lower quadrant of her abdomen.  She is going to see Dr. Dulce Sellar of gastroenterology.  She has had no fever.  There is no nausea or vomiting.  There is no change in bowel or bladder habits.  She has had no bleeding.  She has had no rashes.  There is been no leg swelling.  Overall, I would say that her performance status is ECOG 0.     Medications:  Current Outpatient Medications:    clonazePAM (KLONOPIN) 1 MG tablet, Take 2 mg by mouth at bedtime., Disp: , Rfl:    lamoTRIgine (LAMICTAL) 200 MG tablet, Take 200 mg by mouth daily., Disp: , Rfl:    melatonin 5 MG TABS, 2 gummies, Disp: , Rfl:    meloxicam (MOBIC) 15 MG tablet, 1 tablet, Disp: , Rfl:    metoprolol succinate (TOPROL-XL) 25 MG 24 hr tablet, Take 1 tablet by mouth daily., Disp: , Rfl:    pantoprazole (PROTONIX) 40 MG tablet, Take 40 mg by mouth daily., Disp: , Rfl:    QUEtiapine (SEROQUEL) 400 MG tablet, Take 400 mg by mouth at bedtime., Disp: , Rfl:   Allergies:  Allergies  Allergen Reactions   Minocycline Other (See Comments)    Fever, outbreak     Past Medical History, Surgical history, Social history, and Family History were reviewed and updated.  Review of Systems: Review of Systems  Constitutional: Negative.   HENT:  Negative.    Eyes: Negative.   Respiratory: Negative.    Cardiovascular: Negative.    Gastrointestinal: Negative.   Endocrine: Negative.   Genitourinary: Negative.    Musculoskeletal: Negative.   Skin:  Positive for rash.  Neurological: Negative.   Hematological: Negative.   Psychiatric/Behavioral: Negative.      Physical Exam:  height is 5\' 4"  (1.626 m) and weight is 152 lb 1.3 oz (69 kg). Her oral temperature is 98.2 F (36.8 C). Her blood pressure is 136/84 and her pulse is 72. Her respiration is 18 and oxygen saturation is 100%.   Wt Readings from Last 3 Encounters:  04/26/23 152 lb 1.3 oz (69 kg)  04/26/22 167 lb (75.8 kg)  09/04/21 170 lb 12.8 oz (77.5 kg)    Physical Exam Vitals reviewed.  HENT:     Head: Normocephalic and atraumatic.  Eyes:     Pupils: Pupils are equal, round, and reactive to light.  Cardiovascular:     Rate and Rhythm: Normal rate and regular rhythm.     Heart sounds: Normal heart sounds.  Pulmonary:     Effort: Pulmonary effort is normal.     Breath sounds: Normal breath sounds.  Abdominal:     General: Bowel sounds are normal.     Palpations: Abdomen is soft.  Musculoskeletal:        General: No  tenderness or deformity. Normal range of motion.     Cervical back: Normal range of motion.  Lymphadenopathy:     Cervical: No cervical adenopathy.  Skin:    General: Skin is warm and dry.     Findings: No erythema or rash.  Neurological:     Mental Status: She is alert and oriented to person, place, and time.  Psychiatric:        Behavior: Behavior normal.        Thought Content: Thought content normal.        Judgment: Judgment normal.      Lab Results  Component Value Date   WBC 5.3 04/26/2023   HGB 13.0 04/26/2023   HCT 39.1 04/26/2023   MCV 91.1 04/26/2023   PLT 288 04/26/2023     Chemistry      Component Value Date/Time   NA 141 04/26/2023 0917   K 4.1 04/26/2023 0917   CL 106 04/26/2023 0917   CO2 29 04/26/2023 0917   BUN 15 04/26/2023 0917   CREATININE 0.97 04/26/2023 0917   CREATININE 0.91 07/20/2021  1017      Component Value Date/Time   CALCIUM 10.5 (H) 04/26/2023 0917   ALKPHOS 61 04/26/2023 0917   AST 14 (L) 04/26/2023 0917   ALT 11 04/26/2023 0917   BILITOT 0.5 04/26/2023 0917      Impression and Plan: Catherine Fry is a very charming 57 year old white female.  She had mild, at best, splenomegaly.  Again, I do believe this can be idiopathic and not malignant in any way.  I think the fact that her spleen is smaller in size now goes along with the fact that this is an idiopathic problem.  At this point, as nice as she is, I just do not think we have to see her.  I am does not add anything to her medical care right now.  I told her that if there are any problems down the road, we will be more than happy to see her back.  She is a lot of fun to talk to.   Josph Macho, MD 5/17/202410:42 AM

## 2023-04-29 DIAGNOSIS — K219 Gastro-esophageal reflux disease without esophagitis: Secondary | ICD-10-CM | POA: Diagnosis not present

## 2023-04-29 DIAGNOSIS — K5900 Constipation, unspecified: Secondary | ICD-10-CM | POA: Diagnosis not present

## 2023-04-29 DIAGNOSIS — Z86010 Personal history of colonic polyps: Secondary | ICD-10-CM | POA: Diagnosis not present

## 2023-05-08 ENCOUNTER — Ambulatory Visit (HOSPITAL_BASED_OUTPATIENT_CLINIC_OR_DEPARTMENT_OTHER)
Payer: Medicare PPO | Attending: Student in an Organized Health Care Education/Training Program | Admitting: Physical Therapy

## 2023-05-08 DIAGNOSIS — M25551 Pain in right hip: Secondary | ICD-10-CM | POA: Insufficient documentation

## 2023-05-08 DIAGNOSIS — M6281 Muscle weakness (generalized): Secondary | ICD-10-CM | POA: Insufficient documentation

## 2023-05-08 DIAGNOSIS — M25552 Pain in left hip: Secondary | ICD-10-CM | POA: Insufficient documentation

## 2023-05-08 NOTE — Therapy (Signed)
OUTPATIENT PHYSICAL THERAPY LOWER EXTREMITY EVALUATION   Patient Name: Catherine Fry MRN: [id number] DOB:03/17/1966, 57 y.o., female Today's Date: 05/09/2023  END OF SESSION:  PT End of Session - 05/08/23 1450     Visit Number 1    Number of Visits 12    Date for PT Re-Evaluation 06/19/23    Authorization Type [employer] MCR    PT Start Time 1407    PT Stop Time 1458    PT Time Calculation (min) 51 min    Activity Tolerance Patient tolerated treatment well    Behavior During Therapy WFL for tasks assessed/performed             Past Medical History:  Diagnosis Date   Atypical mole    moderte right upper buttock   Insomnia    Tachycardia    Past Surgical History:  Procedure Laterality Date   OOPHORECTOMY Right    TONSILLECTOMY     There are no problems to display for this patient.    REFERRING PROVIDER: Johnnette Litter MD  REFERRING DIAG: [phone] (ICD-10-CM) - Tendinopathy of gluteal region  THERAPY DIAG:  Pain in left hip  Pain in right hip  Muscle weakness (generalized)  Rationale for Evaluation and Treatment: Rehabilitation  ONSET DATE: chronic pain / early April 2024  SUBJECTIVE:   SUBJECTIVE STATEMENT: Pt states she has been very active in her past, but has not been as active in the last 10-15 years.  She states she has put on some weight over the past 10-15 years.  Pt has a hx of lumbar injections due to having generalized pain in lumbar and bilat LE's.    Pt reports pain beginning 2.5 to 3 years ago.  Pt reports having pain worsening about 7 weeks ago, but not sure why.  Pt has seen several MD's and received chiropractic care, PT, and massage therapy.  Pt states she researched her sx's and thought she had gluteal tendinopathy.  Pt went to see Dr. Allena Katz who agrees with pt.  Pt had x rays.  Pt has received cortisone shots in bilat hips.  Pt states meloxicam helps.  Pt states she is improving overall and able to sleep better.    PT order on 5/9  indicates bilat gluteal tendinopathy.  PT order also indicated to have pt participate in aquatic therapy to improve bilat LE proximal muscle strength, use other modalities, and provide pt with home exercise regimen.  Order also indicated R hip trochanteric bursitis.  Pt had PT approx 3-4 months ago.  Pt has a HEP though has not been performing her HEP.   Pt reports having pain in bilat glutes, lateral hips and thighs.  Pt reports having pain later after squatting to take care of her dog.  Pt has increased pain with bathing including turning to reach her side and legs.  Pt has pain with repetitive bending.  Pt avoids bending and lifting.  Pt is very limited with household chores and has someone to come and clean her home.  Pt has pain with stairs.  She walks 20 mins every AM.  Pt has increased pain with walking up/down hills.     PERTINENT HISTORY: Chronic pain Tachycardia IBS  PAIN:  Are you having pain? Yes NPRS:  4/10 current, 9/10 worst, 2/10 best Location:  bilat thighs and lateral hips, L > R  PRECAUTIONS: None  WEIGHT BEARING RESTRICTIONS: No  FALLS:  Has patient fallen in last 6 months? No  LIVING ENVIRONMENT: Lives  with: lives with their spouse Lives in: 2 story home Stairs: yes  OCCUPATION: Pt is retired   PLOF: Independent.  Pt has had chronic pain which has affected her daily activities and daily mobility.   PATIENT GOALS: strengthen core and LE's, improve pain, and to be able to function in everyday activities.    OBJECTIVE:   DIAGNOSTIC FINDINGS: Pt reports having x rays.  Unable to view x rays in Epic.    PATIENT SURVEYS:  FOTO 28 with a goal of 52 at visit #14  COGNITION: Overall cognitive status: Within functional limits for tasks assessed      PALPATION: TTP:  bilat glutes, GT  LOWER EXTREMITY ROM:  Active ROM Right eval Left eval  Hip flexion    Hip extension [facility]  Hip abduction [facility]  Hip adduction    Hip internal rotation    Hip  external rotation    Knee flexion    Knee extension    Ankle dorsiflexion    Ankle plantarflexion    Ankle inversion    Ankle eversion     (Blank rows = not tested)  LOWER EXTREMITY MMT:  MMT Right eval Left eval  Hip flexion 5/5 4+/5  Hip extension 5/5 4/5  Hip abduction 5/5 4/5  Hip adduction    Hip internal rotation 5/5 5/5 with pain  Hip external rotation 5/5 4+/5  Knee flexion 5/5 5/5  Knee extension 5/5 5/5  Ankle dorsiflexion    Ankle plantarflexion    Ankle inversion    Ankle eversion     (Blank rows = not tested)  LOWER EXTREMITY SPECIAL TESTS:  Ober's test: negative bilat   GAIT: Assistive device utilized: None Level of assistance: Complete Independence Comments: Pt ambulates with a normalized gait pattern without limping.   TODAY'S TREATMENT:                                                                                                                                 See below for pt education.  PATIENT EDUCATION:  Education details: PT educated pt concerning aquatic process and benefits/properties/purpose of aquatic therapy.  Objective findings, dx, POC, rationale of interventions, and relevant anatomy.  PT answered pt's questions.  Person educated: Patient Education method: Explanation Education comprehension: verbalized understanding  HOME EXERCISE PROGRAM: Pt has a HEP.  Will give at a later date.  ASSESSMENT:  CLINICAL IMPRESSION: Patient is a 57 y.o. female with a dx of gluteal tendinopathy presenting to the clinic with L > R hip pain, pain in bilat thighs and L LE muscle weakness.  Pt has chronic pain which exacerbated 7-8 weeks ago.  Pt received cortisone shots in bilat hips and is taking meloxicam.  Pt states she is improving and is able to sleep better.  Pt has a MD order for PT to begin aquatic therapy.  Pt avoids bending and lifting.  She is very limited with household chores  and has pain after squatting to take care of her dog.  She has  increased pain with bathing and also performing stairs.  She performs a walking program every AM and has increased pain with walking up/down hills.  Pt should benefit from skilled PT services to address impairments and to improve overall function.             OBJECTIVE IMPAIRMENTS: decreased activity tolerance, decreased mobility, decreased strength, increased fascial restrictions, and pain.   ACTIVITY LIMITATIONS: lifting, bending, squatting, stairs, and bathing, ambulating up/down hills  PARTICIPATION LIMITATIONS: cleaning  PERSONAL FACTORS: Time since onset of injury/illness/exacerbation are also affecting patient's functional outcome.   REHAB POTENTIAL: Good  CLINICAL DECISION MAKING: Stable/uncomplicated  EVALUATION COMPLEXITY: Low   GOALS:   SHORT TERM GOALS: Target date: 05/29/2023  Pt will tolerate aquatic therapy without adverse effects for improved mobility, pain,strength, and function.  Baseline: Goal status: INITIAL  2.  Pt will report at least a 25% improvement in pain and sx's overall.   Baseline:  Goal status: INITIAL  3.  Pt will report improved tolerance with performing household chores.  Baseline:  Goal status: INITIAL Target date:  06/05/2023    LONG TERM GOALS: Target date: 06/19/2023  Pt will report she is able to bathe without increased pain.  Baseline:  Goal status: INITIAL  2.  Pt will be independent with land based and aquatic HEP for improved strength, pain, and function.   Baseline:  Goal status: INITIAL  3.  Pt will report she is able to perform her walking program including ambulating up/down hills without significant pain and limitation.  Baseline:  Goal status: INITIAL  4.  Pt will demo improved L hip strength to 5/5 MMT for improved tolerance with and performance of functional mobility skills.  Baseline:  Goal status: INITIAL    PLAN:  PT FREQUENCY: 2x/week  PT DURATION: 6 weeks  PLANNED INTERVENTIONS: Therapeutic exercises,  Therapeutic activity, Neuromuscular re-education, Balance training, Gait training, Patient/Family education, Self Care, Joint mobilization, Stair training, Aquatic Therapy, Dry Needling, Electrical stimulation, Spinal mobilization, Cryotherapy, Moist heat, Taping, Ultrasound, Manual therapy, and Re-evaluation  PLAN FOR NEXT SESSION: Land visit next Rx.  Review HEP and update/add to HEP.  Aquatics to follow.  Referring diagnosis? M67.959 (ICD-10-CM) - Tendinopathy of gluteal region  Treatment diagnosis? (if different than referring diagnosis)  Pain in left hip  Pain in right hip  Muscle weakness (generalized)  What was this (referring dx) caused by? []  Surgery []  Fall [x]  Ongoing issue []  Arthritis []  Other: ____________  Laterality: [x]  Rt [x]  Lt []  Both  Check all possible CPT codes:  *CHOOSE 10 OR LESS*    [x]  97110 (Therapeutic Exercise)  []  92507 (SLP Treatment)  [x]  97112 (Neuro Re-ed)   []  92526 (Swallowing Treatment)   [x]  97116 (Gait Training)   []  [id number] (Cognitive Training, 1st 15 minutes) [x]  97140 (Manual Therapy)   []  97130 (Cognitive Training, each add'l 15 minutes)  [x]  97164 (Re-evaluation)                              []  Other, List CPT Code ____________  [x]  97530 (Therapeutic Activities)     [x]  97535 (Self Care)   []  All codes above (97110 - 97535)  []  97012 (Mechanical Traction)  [x]  97014 (E-stim Unattended)  [x]  97032 (E-stim manual)  []  97033 (Ionto)  [x]  97035 (Ultrasound) [x]  16109 (Physical Performance Training) [x]  [id number] (Aquatic  Therapy) []  97016 (Vasopneumatic Device) []  [id number] (Paraffin) []  97034 (Contrast Bath) []  97597 (Wound Care 1st 20 sq cm) []  97598 (Wound Care each add'l 20 sq cm) []  97760 (Orthotic Fabrication, Fitting, Training Initial) []  [id number] (Prosthetic Management and Training Initial) []  [phone] (Orthotic or Prosthetic Training/ Modification Subsequent)   Audie Clear III PT, DPT 05/09/23 9:46 PM

## 2023-05-09 ENCOUNTER — Other Ambulatory Visit: Payer: Self-pay

## 2023-05-09 ENCOUNTER — Encounter (HOSPITAL_BASED_OUTPATIENT_CLINIC_OR_DEPARTMENT_OTHER): Payer: Self-pay | Admitting: Physical Therapy

## 2023-05-20 DIAGNOSIS — M79674 Pain in right toe(s): Secondary | ICD-10-CM | POA: Diagnosis not present

## 2023-05-20 DIAGNOSIS — L600 Ingrowing nail: Secondary | ICD-10-CM | POA: Diagnosis not present

## 2023-05-20 DIAGNOSIS — M79675 Pain in left toe(s): Secondary | ICD-10-CM | POA: Diagnosis not present

## 2023-05-22 ENCOUNTER — Encounter (HOSPITAL_BASED_OUTPATIENT_CLINIC_OR_DEPARTMENT_OTHER): Payer: Self-pay

## 2023-05-22 ENCOUNTER — Ambulatory Visit (HOSPITAL_BASED_OUTPATIENT_CLINIC_OR_DEPARTMENT_OTHER): Payer: Medicare PPO | Admitting: Physical Therapy

## 2023-05-28 ENCOUNTER — Encounter (HOSPITAL_BASED_OUTPATIENT_CLINIC_OR_DEPARTMENT_OTHER): Payer: Self-pay

## 2023-05-28 ENCOUNTER — Ambulatory Visit (HOSPITAL_BASED_OUTPATIENT_CLINIC_OR_DEPARTMENT_OTHER): Payer: Medicare PPO | Admitting: Physical Therapy

## 2023-05-30 ENCOUNTER — Ambulatory Visit (HOSPITAL_BASED_OUTPATIENT_CLINIC_OR_DEPARTMENT_OTHER): Payer: Medicare PPO | Admitting: Physical Therapy

## 2023-05-30 ENCOUNTER — Encounter (HOSPITAL_BASED_OUTPATIENT_CLINIC_OR_DEPARTMENT_OTHER): Payer: Self-pay

## 2023-06-11 ENCOUNTER — Ambulatory Visit (HOSPITAL_BASED_OUTPATIENT_CLINIC_OR_DEPARTMENT_OTHER)
Payer: Medicare PPO | Attending: Student in an Organized Health Care Education/Training Program | Admitting: Physical Therapy

## 2023-06-11 ENCOUNTER — Encounter (HOSPITAL_BASED_OUTPATIENT_CLINIC_OR_DEPARTMENT_OTHER): Payer: Self-pay | Admitting: Physical Therapy

## 2023-06-11 DIAGNOSIS — M6281 Muscle weakness (generalized): Secondary | ICD-10-CM | POA: Diagnosis not present

## 2023-06-11 DIAGNOSIS — M25551 Pain in right hip: Secondary | ICD-10-CM | POA: Insufficient documentation

## 2023-06-11 DIAGNOSIS — M25552 Pain in left hip: Secondary | ICD-10-CM | POA: Diagnosis not present

## 2023-06-11 NOTE — Therapy (Signed)
OUTPATIENT PHYSICAL THERAPY LOWER EXTREMITY TREATMENT   Patient Name: Catherine Fry MRN: [id number] DOB:07-08-1966, 57 y.o., female Today's Date: 06/11/2023  END OF SESSION:  PT End of Session - 06/11/23 1150     Visit Number 2    Number of Visits 12    Date for PT Re-Evaluation 06/19/23    Authorization Type [employer] MCR    PT Start Time 1150    PT Stop Time 1241    PT Time Calculation (min) 51 min    Activity Tolerance Patient tolerated treatment well    Behavior During Therapy WFL for tasks assessed/performed             Past Medical History:  Diagnosis Date   Atypical mole    moderte right upper buttock   Insomnia    Tachycardia    Past Surgical History:  Procedure Laterality Date   OOPHORECTOMY Right    TONSILLECTOMY     There are no problems to display for this patient.    REFERRING PROVIDER: Johnnette Litter MD  REFERRING DIAG: [phone] (ICD-10-CM) - Tendinopathy of gluteal region  THERAPY DIAG:  Pain in left hip  Pain in right hip  Muscle weakness (generalized)  Rationale for Evaluation and Treatment: Rehabilitation  ONSET DATE: chronic pain / early April 2024  SUBJECTIVE:   SUBJECTIVE STATEMENT: Pt reports since evaluation she sprained Lt ankle and also had part of both great toe nails removed due to ingrown nails. She reports continued pain along bilat lateral hip to thighs.     From Eval:  Pt states she has been very active in her past, but has not been as active in the last 10-15 years.  She states she has put on some weight over the past 10-15 years.  Pt has a hx of lumbar injections due to having generalized pain in lumbar and bilat LE's.    Pt reports pain beginning 2.5 to 3 years ago.  Pt reports having pain worsening about 7 weeks ago, but not sure why.  Pt has seen several MD's and received chiropractic care, PT, and massage therapy.  Pt states she researched her sx's and thought she had gluteal tendinopathy.  Pt went to see Dr. Allena Katz  who agrees with pt.  Pt had x rays.  Pt has received cortisone shots in bilat hips.  Pt states meloxicam helps.  Pt states she is improving overall and able to sleep better.    PT order on 5/9 indicates bilat gluteal tendinopathy.  PT order also indicated to have pt participate in aquatic therapy to improve bilat LE proximal muscle strength, use other modalities, and provide pt with home exercise regimen.  Order also indicated R hip trochanteric bursitis.  Pt had PT approx 3-4 months ago.  Pt has a HEP though has not been performing her HEP.   Pt reports having pain in bilat glutes, lateral hips and thighs.  Pt reports having pain later after squatting to take care of her dog.  Pt has increased pain with bathing including turning to reach her side and legs.  Pt has pain with repetitive bending.  Pt avoids bending and lifting.  Pt is very limited with household chores and has someone to come and clean her home.  Pt has pain with stairs.  She walks 20 mins every AM.  Pt has increased pain with walking up/down hills.     PERTINENT HISTORY: Chronic pain Tachycardia IBS  PAIN:  Are you having pain? Yes NPRS:  4/10 current, 9/10 worst, 2/10 best Location:  bilat thighs and lateral hips, L > R  PRECAUTIONS: None  WEIGHT BEARING RESTRICTIONS: No  FALLS:  Has patient fallen in last 6 months? No  LIVING ENVIRONMENT: Lives with: lives with their spouse Lives in: 2 story home Stairs: yes  OCCUPATION: Pt is retired   PLOF: Independent.  Pt has had chronic pain which has affected her daily activities and daily mobility.   PATIENT GOALS: strengthen core and LE's, improve pain, and to be able to function in everyday activities.    OBJECTIVE:   DIAGNOSTIC FINDINGS: Pt reports having x rays.  Unable to view x rays in Epic.    PATIENT SURVEYS:  FOTO 28 with a goal of 52 at visit #14  COGNITION: Overall cognitive status: Within functional limits for tasks assessed      PALPATION: TTP:   bilat glutes, GT  LOWER EXTREMITY ROM:  Active ROM Right eval Left eval  Hip flexion    Hip extension [facility]  Hip abduction [facility]  Hip adduction    Hip internal rotation    Hip external rotation    Knee flexion    Knee extension    Ankle dorsiflexion    Ankle plantarflexion    Ankle inversion    Ankle eversion     (Blank rows = not tested)  LOWER EXTREMITY MMT:  MMT Right eval Left eval  Hip flexion 5/5 4+/5  Hip extension 5/5 4/5  Hip abduction 5/5 4/5  Hip adduction    Hip internal rotation 5/5 5/5 with pain  Hip external rotation 5/5 4+/5  Knee flexion 5/5 5/5  Knee extension 5/5 5/5  Ankle dorsiflexion    Ankle plantarflexion    Ankle inversion    Ankle eversion     (Blank rows = not tested)  LOWER EXTREMITY SPECIAL TESTS:  Ober's test: negative bilat   GAIT: Assistive device utilized: None Level of assistance: Complete Independence Comments: Pt ambulates with a normalized gait pattern without limping.   TODAY'S TREATMENT:                                                                                                                               Pt seen for aquatic therapy today.  Treatment took place in water 3.5-4.75 ft in depth at the [facility] pool. Temp of water was 91.  Pt entered/exited the pool via stairs independently in step through pattern with bilat rail. * intro to aquatic therapy principles * unsupported: walking forward / backward with cues for vertical trunk, side stepping * side stepping with arm addct/ abdct with rainbow hand floats  * walking / marching with bilat/single rainbow hand floats under the water for increased core engagement * holding wall: Leg swings into hip flex/ ext, cues to control height of LE * UE on yellow hand floats:  Hip crosses / hip openers,  alternating single leg clams; straight LE circles (  draw circle on ground with toes) 2 x 5 * fig 4 stretch at stairs x 10s each * once dried off:  IASTM to  Lt quad and lateral thigh to decrease fascial restrictions and improve ROM.   Pt requires the buoyancy and hydrostatic pressure of water for support, and to offload joints by unweighting joint load by at least 50 % in navel deep water and by at least 75-80% in chest to neck deep water.  Viscosity of the water is needed for resistance of strengthening. Water current perturbations provides challenge to standing balance requiring increased core activation.    PATIENT EDUCATION:  Education details: aquatic therapy intro Person educated: Patient Education method: Explanation Education comprehension: verbalized understanding  HOME EXERCISE PROGRAM: Pt has a HEP.  Will give at a later date.  ASSESSMENT:  CLINICAL IMPRESSION: Pt is observed holding LLE slightly more guarded/rigid than RLE. She reports continued pain in posterior Lt SI/hip with trunk and hip extension.   Tolerated all aquatic exercises well, reporting some fatigue in Lt glute with SLS.   Pt may benefit from IASTM and ktaping trial to hip and lateral leg to address potential fascial restrictions. She required only minor cues for posture and LE positioning during session. She is confident in aquatic environment and able to take direction from therapist on deck.   Pt should benefit from skilled PT services to address impairments and to improve overall function.             OBJECTIVE IMPAIRMENTS: decreased activity tolerance, decreased mobility, decreased strength, increased fascial restrictions, and pain.   ACTIVITY LIMITATIONS: lifting, bending, squatting, stairs, and bathing, ambulating up/down hills  PARTICIPATION LIMITATIONS: cleaning  PERSONAL FACTORS: Time since onset of injury/illness/exacerbation are also affecting patient's functional outcome.   REHAB POTENTIAL: Good  CLINICAL DECISION MAKING: Stable/uncomplicated  EVALUATION COMPLEXITY: Low   GOALS:   SHORT TERM GOALS: Target date: 05/29/2023  Pt will tolerate  aquatic therapy without adverse effects for improved mobility, pain,strength, and function.  Baseline: Goal status: INITIAL  2.  Pt will report at least a 25% improvement in pain and sx's overall.   Baseline:  Goal status: INITIAL  3.  Pt will report improved tolerance with performing household chores.  Baseline:  Goal status: INITIAL Target date:  06/05/2023    LONG TERM GOALS: Target date: 06/19/2023  Pt will report she is able to bathe without increased pain.  Baseline:  Goal status: INITIAL  2.  Pt will be independent with land based and aquatic HEP for improved strength, pain, and function.   Baseline:  Goal status: INITIAL  3.  Pt will report she is able to perform her walking program including ambulating up/down hills without significant pain and limitation.  Baseline:  Goal status: INITIAL  4.  Pt will demo improved L hip strength to 5/5 MMT for improved tolerance with and performance of functional mobility skills.  Baseline:  Goal status: INITIAL    PLAN:  PT FREQUENCY: 2x/week  PT DURATION: 6 weeks  PLANNED INTERVENTIONS: Therapeutic exercises, Therapeutic activity, Neuromuscular re-education, Balance training, Gait training, Patient/Family education, Self Care, Joint mobilization, Stair training, Aquatic Therapy, Dry Needling, Electrical stimulation, Spinal mobilization, Cryotherapy, Moist heat, Taping, Ultrasound, Manual therapy, and Re-evaluation  PLAN FOR NEXT SESSION:  Aquatics; Lyman Speller, PTA 06/11/23 1:43 PM [facility] [address] [place], [place], [address] Phone: [phone]   Fax:  [phone]    Referring diagnosis? M67.959 (ICD-10-CM) - Tendinopathy  of gluteal region  Treatment diagnosis? (if different than referring diagnosis)  Pain in left hip  Pain in right hip  Muscle weakness (generalized)  What was this (referring dx) caused by? []  Surgery []   Fall [x]  Ongoing issue []  Arthritis []  Other: ____________  Laterality: [x]  Rt [x]  Lt []  Both  Check all possible CPT codes:  *CHOOSE 10 OR LESS*    [x]  97110 (Therapeutic Exercise)  []  92507 (SLP Treatment)  [x]  97112 (Neuro Re-ed)   []  16109 (Swallowing Treatment)   [x]  97116 (Gait Training)   []  [id number] (Cognitive Training, 1st 15 minutes) [x]  97140 (Manual Therapy)   []  97130 (Cognitive Training, each add'l 15 minutes)  [x]  97164 (Re-evaluation)                              []  Other, List CPT Code ____________  [x]  97530 (Therapeutic Activities)     [x]  97535 (Self Care)   []  All codes above (97110 - 97535)  []  97012 (Mechanical Traction)  [x]  97014 (E-stim Unattended)  [x]  97032 (E-stim manual)  []  97033 (Ionto)  [x]  97035 (Ultrasound) [x]  97750 (Physical Performance Training) [x]  [id number] (Aquatic Therapy) []  97016 (Vasopneumatic Device) []  [id number] (Paraffin) []  97034 (Contrast Bath) []  97597 (Wound Care 1st 20 sq cm) []  97598 (Wound Care each add'l 20 sq cm) []  97760 (Orthotic Fabrication, Fitting, Training Initial) []  [id number] (Prosthetic Management and Training Initial) []  [id number] (Orthotic or Prosthetic Training/ Modification Subsequent)

## 2023-06-14 ENCOUNTER — Encounter (HOSPITAL_BASED_OUTPATIENT_CLINIC_OR_DEPARTMENT_OTHER): Payer: Self-pay | Admitting: Physical Therapy

## 2023-06-14 ENCOUNTER — Ambulatory Visit (HOSPITAL_BASED_OUTPATIENT_CLINIC_OR_DEPARTMENT_OTHER): Payer: Medicare PPO | Admitting: Physical Therapy

## 2023-06-14 DIAGNOSIS — M25552 Pain in left hip: Secondary | ICD-10-CM | POA: Diagnosis not present

## 2023-06-14 DIAGNOSIS — M25551 Pain in right hip: Secondary | ICD-10-CM | POA: Diagnosis not present

## 2023-06-14 DIAGNOSIS — M6281 Muscle weakness (generalized): Secondary | ICD-10-CM | POA: Diagnosis not present

## 2023-06-14 NOTE — Therapy (Signed)
OUTPATIENT PHYSICAL THERAPY LOWER EXTREMITY TREATMENT   Patient Name: Catherine Fry MRN: [id number] DOB:08-Mar-1966, 57 y.o., female Today's Date: 06/14/2023  END OF SESSION:  PT End of Session - 06/14/23 1357     Visit Number 3    Number of Visits 12    Date for PT Re-Evaluation 06/19/23    Authorization Type [employer] MCR    PT Start Time 1147    PT Stop Time 1225    PT Time Calculation (min) 38 min             Past Medical History:  Diagnosis Date   Atypical mole    moderte right upper buttock   Insomnia    Tachycardia    Past Surgical History:  Procedure Laterality Date   OOPHORECTOMY Right    TONSILLECTOMY     There are no problems to display for this patient.    REFERRING PROVIDER: Johnnette Litter MD  REFERRING DIAG: [phone] (ICD-10-CM) - Tendinopathy of gluteal region  THERAPY DIAG:  Pain in left hip  Pain in right hip  Muscle weakness (generalized)  Rationale for Evaluation and Treatment: Rehabilitation  ONSET DATE: chronic pain / early April 2024  SUBJECTIVE:   SUBJECTIVE STATEMENT: Pt reports she noticed more Lt hip pain when pulling leg up (fig 4) to put lotion on her toes.     From Eval:  Pt states she has been very active in her past, but has not been as active in the last 10-15 years.  She states she has put on some weight over the past 10-15 years.  Pt has a hx of lumbar injections due to having generalized pain in lumbar and bilat LE's.    Pt reports pain beginning 2.5 to 3 years ago.  Pt reports having pain worsening about 7 weeks ago, but not sure why.  Pt has seen several MD's and received chiropractic care, PT, and massage therapy.  Pt states she researched her sx's and thought she had gluteal tendinopathy.  Pt went to see Dr. Allena Katz who agrees with pt.  Pt had x rays.  Pt has received cortisone shots in bilat hips.  Pt states meloxicam helps.  Pt states she is improving overall and able to sleep better.    PT order on 5/9 indicates  bilat gluteal tendinopathy.  PT order also indicated to have pt participate in aquatic therapy to improve bilat LE proximal muscle strength, use other modalities, and provide pt with home exercise regimen.  Order also indicated R hip trochanteric bursitis.  Pt had PT approx 3-4 months ago.  Pt has a HEP though has not been performing her HEP.   Pt reports having pain in bilat glutes, lateral hips and thighs.  Pt reports having pain later after squatting to take care of her dog.  Pt has increased pain with bathing including turning to reach her side and legs.  Pt has pain with repetitive bending.  Pt avoids bending and lifting.  Pt is very limited with household chores and has someone to come and clean her home.  Pt has pain with stairs.  She walks 20 mins every AM.  Pt has increased pain with walking up/down hills.     PERTINENT HISTORY: Chronic pain Tachycardia IBS  PAIN:  Are you having pain? Yes NPRS:  6/10 current, 9/10 with touch to area Location:  bilat thighs and lateral hips, L > R  PRECAUTIONS: None  WEIGHT BEARING RESTRICTIONS: No  FALLS:  Has patient fallen  in last 6 months? No  LIVING ENVIRONMENT: Lives with: lives with their spouse Lives in: 2 story home Stairs: yes  OCCUPATION: Pt is retired   PLOF: Independent.  Pt has had chronic pain which has affected her daily activities and daily mobility.   PATIENT GOALS: strengthen core and LE's, improve pain, and to be able to function in everyday activities.    OBJECTIVE:   DIAGNOSTIC FINDINGS: Pt reports having x rays.  Unable to view x rays in Epic.    PATIENT SURVEYS:  FOTO 28 with a goal of 52 at visit #14  COGNITION: Overall cognitive status: Within functional limits for tasks assessed      PALPATION: TTP:  bilat glutes, GT  LOWER EXTREMITY ROM:  Active ROM Right eval Left eval  Hip flexion    Hip extension [facility]  Hip abduction [facility]  Hip adduction    Hip internal rotation    Hip external  rotation    Knee flexion    Knee extension    Ankle dorsiflexion    Ankle plantarflexion    Ankle inversion    Ankle eversion     (Blank rows = not tested)  LOWER EXTREMITY MMT:  MMT Right eval Left eval  Hip flexion 5/5 4+/5  Hip extension 5/5 4/5  Hip abduction 5/5 4/5  Hip adduction    Hip internal rotation 5/5 5/5 with pain  Hip external rotation 5/5 4+/5  Knee flexion 5/5 5/5  Knee extension 5/5 5/5  Ankle dorsiflexion    Ankle plantarflexion    Ankle inversion    Ankle eversion     (Blank rows = not tested)  LOWER EXTREMITY SPECIAL TESTS:  Ober's test: negative bilat   GAIT: Assistive device utilized: None Level of assistance: Complete Independence Comments: Pt ambulates with a normalized gait pattern without limping.   TODAY'S TREATMENT:                                                                                                                                Manual therapy, prior to entry in water:  IASTM to Lt quad,lateral thigh, and  glute max insertion to decrease fascial restrictions and improve ROM.    Pt seen for aquatic therapy today.  Treatment took place in water 3.5-4.75 ft in depth at the [facility] pool. Temp of water was 91.  Pt entered/exited the pool via stairs independently in step through pattern with bilat rail.  * unsupported: walking forward / backward with cues for vertical trunk, side stepping * side stepping with arm addct/ abdct with rainbow hand floats  * straddling noodle, without UE support:  cycling, cross country ski gentle, jumping jack LEs * holding noodle:  hip openers/ hip crosses ; gentle leg swings into hip flex to hip ext (toe touch)  * return to walking forward/ backwards  * fig 4 stretch at wall  x 10s each (limited tolerance) * once  dried off: I strip of reg Rock tape applied along L ITB, with a star at glute max insertion- pt instructed on safe removal and verbalized understanding.   Pt requires the  buoyancy and hydrostatic pressure of water for support, and to offload joints by unweighting joint load by at least 50 % in navel deep water and by at least 75-80% in chest to neck deep water.  Viscosity of the water is needed for resistance of strengthening. Water current perturbations provides challenge to standing balance requiring increased core activation.    PATIENT EDUCATION:  Education details: aquatic therapy progressions/ modifications  Person educated: Patient Education method: Explanation Education comprehension: verbalized understanding  HOME EXERCISE PROGRAM: Pt has a land HEP.  Will give at a later date.  ASSESSMENT:  CLINICAL IMPRESSION: Trial of IASTM and ktaping to Lt hip and lateral leg to address potential fascial restrictions. Pt reported slight increase in pain in Lt glute max insertion after IASTM, and further pain with any increase in L hip flexion past 90 deg.  Kept intensity of exercise gentle today.    Pt should benefit from skilled PT services to address impairments and to improve overall function.  PT to reassess for recert next visit.            OBJECTIVE IMPAIRMENTS: decreased activity tolerance, decreased mobility, decreased strength, increased fascial restrictions, and pain.   ACTIVITY LIMITATIONS: lifting, bending, squatting, stairs, and bathing, ambulating up/down hills  PARTICIPATION LIMITATIONS: cleaning  PERSONAL FACTORS: Time since onset of injury/illness/exacerbation are also affecting patient's functional outcome.   REHAB POTENTIAL: Good  CLINICAL DECISION MAKING: Stable/uncomplicated  EVALUATION COMPLEXITY: Low   GOALS:   SHORT TERM GOALS: Target date: 05/29/2023  Pt will tolerate aquatic therapy without adverse effects for improved mobility, pain,strength, and function.  Baseline: Goal status: INITIAL  2.  Pt will report at least a 25% improvement in pain and sx's overall.   Baseline:  Goal status: INITIAL  3.  Pt will report  improved tolerance with performing household chores.  Baseline:  Goal status: INITIAL Target date:  06/05/2023    LONG TERM GOALS: Target date: 06/19/2023  Pt will report she is able to bathe without increased pain.  Baseline:  Goal status: INITIAL  2.  Pt will be independent with land based and aquatic HEP for improved strength, pain, and function.   Baseline:  Goal status: INITIAL  3.  Pt will report she is able to perform her walking program including ambulating up/down hills without significant pain and limitation.  Baseline:  Goal status: INITIAL  4.  Pt will demo improved L hip strength to 5/5 MMT for improved tolerance with and performance of functional mobility skills.  Baseline:  Goal status: INITIAL    PLAN:  PT FREQUENCY: 2x/week  PT DURATION: 6 weeks  PLANNED INTERVENTIONS: Therapeutic exercises, Therapeutic activity, Neuromuscular re-education, Balance training, Gait training, Patient/Family education, Self Care, Joint mobilization, Stair training, Aquatic Therapy, Dry Needling, Electrical stimulation, Spinal mobilization, Cryotherapy, Moist heat, Taping, Ultrasound, Manual therapy, and Re-evaluation  PLAN FOR NEXT SESSION:  Aquatics; assess response to IASTM/ ktape  Mayer Camel, PTA 06/14/23 5:40 PM [facility] [address] [place], [place], [address] Phone: [phone]   Fax:  [phone]    Referring diagnosis? M67.959 (ICD-10-CM) - Tendinopathy of gluteal region  Treatment diagnosis? (if different than referring diagnosis)  Pain in left hip  Pain in right hip  Muscle weakness (generalized)  What was this (referring dx) caused  by? []  Surgery []  Fall [x]  Ongoing issue []  Arthritis []  Other: ____________  Laterality: [x]  Rt [x]  Lt []  Both  Check all possible CPT codes:  *CHOOSE 10 OR LESS*    [x]  97110 (Therapeutic Exercise)  []  92507 (SLP Treatment)  [x]  [address] (Neuro  Re-ed)   []  92526 (Swallowing Treatment)   [x]  97116 (Gait Training)   []  60454 (Cognitive Training, 1st 15 minutes) [x]  97140 (Manual Therapy)   []  97130 (Cognitive Training, each add'l 15 minutes)  [x]  97164 (Re-evaluation)                              []  Other, List CPT Code ____________  [x]  97530 (Therapeutic Activities)     [x]  97535 (Self Care)   []  All codes above (97110 - 97535)  []  97012 (Mechanical Traction)  [x]  97014 (E-stim Unattended)  [x]  97032 (E-stim manual)  []  97033 (Ionto)  [x]  97035 (Ultrasound) [x]  97750 (Physical Performance Training) [x]  [id number] (Aquatic Therapy) []  97016 (Vasopneumatic Device) []  [id number] (Paraffin) []  97034 (Contrast Bath) []  97597 (Wound Care 1st 20 sq cm) []  97598 (Wound Care each add'l 20 sq cm) []  97760 (Orthotic Fabrication, Fitting, Training Initial) []  [id number] (Prosthetic Management and Training Initial) []  [id number] (Orthotic or Prosthetic Training/ Modification Subsequent)

## 2023-06-15 IMAGING — US US ABDOMEN LIMITED
1 series · 14 of 18 positions shown · non-contrast
Comparison: July 05, 2021.

CLINICAL DATA: Splenomegaly.

EXAM:
ULTRASOUND ABDOMEN LIMITED

[Series 1: us abdomen limited · 18 acquisitions, 14 frames shown]
[im 1/18]
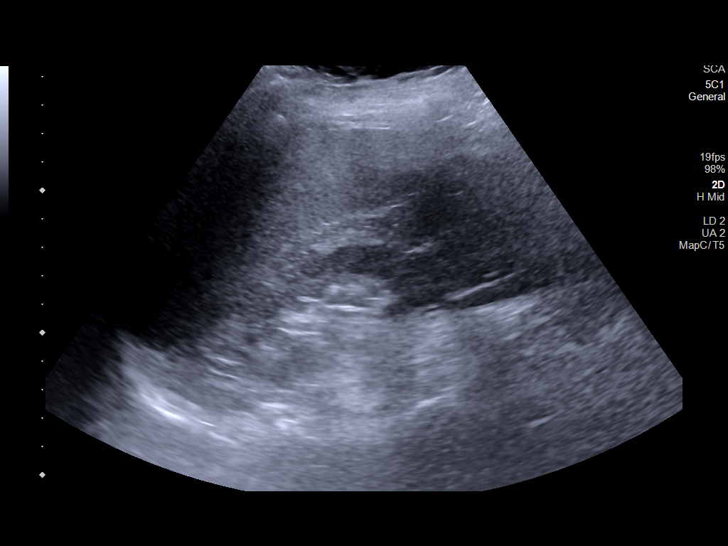
[im 2/18]
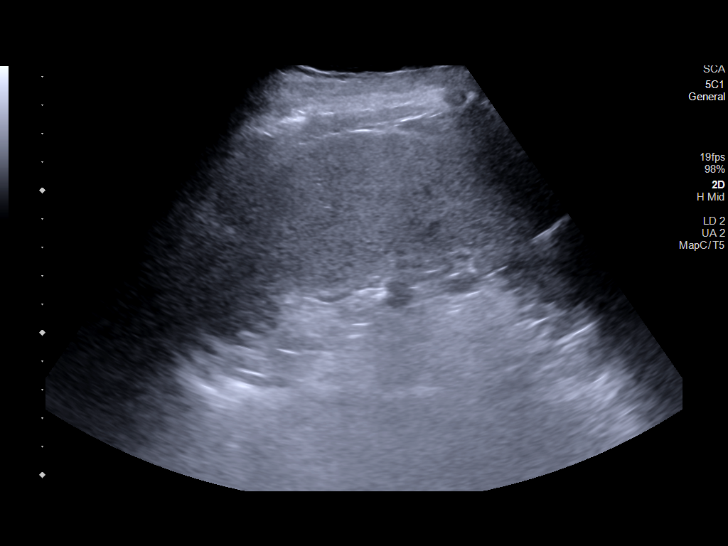
[im 4/18]
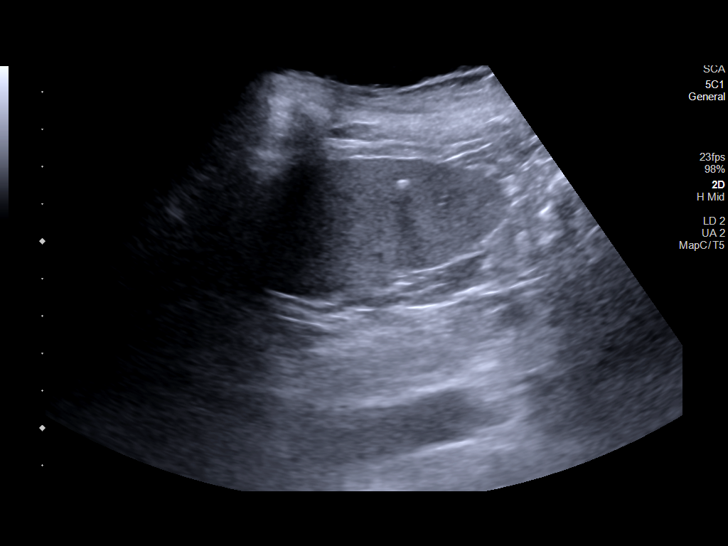
[im 5/18]
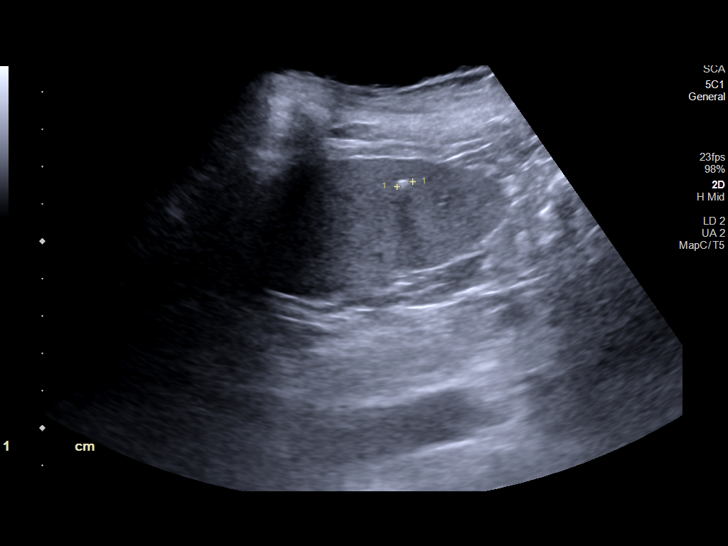
[im 6/18]
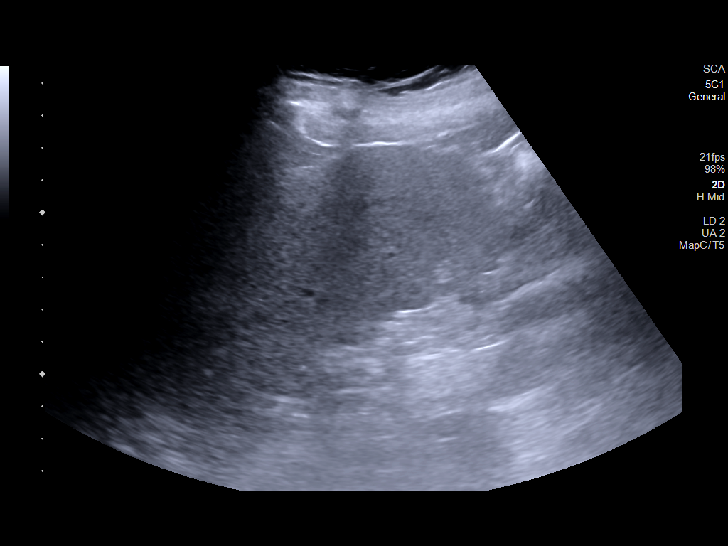
[im 8/18]
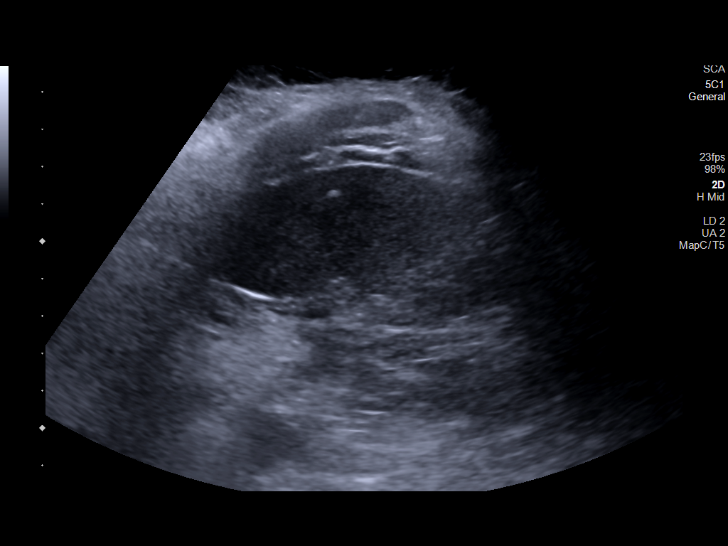
[im 9/18]
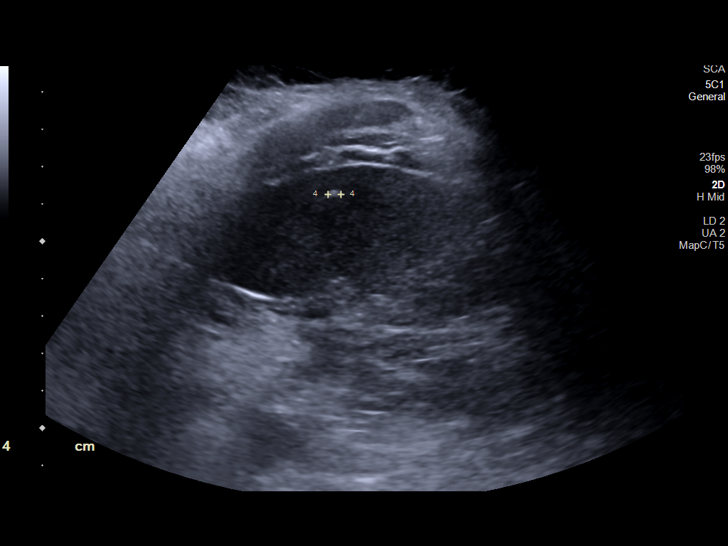
[im 10/18]
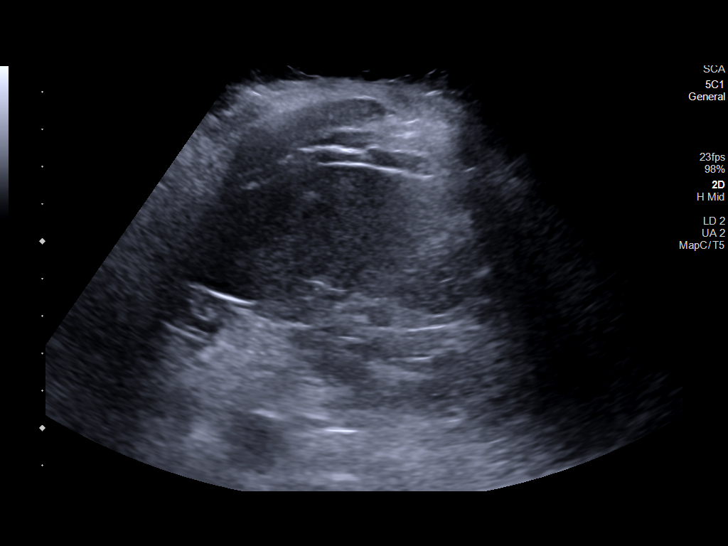
[im 11/18]
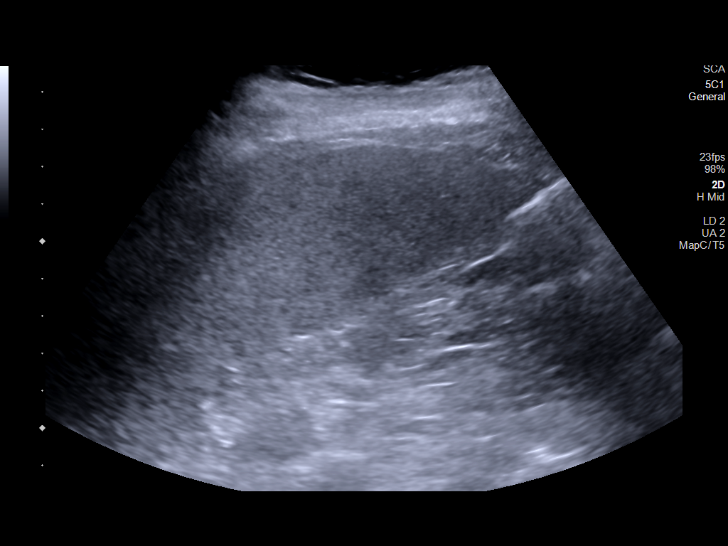
[im 13/18]
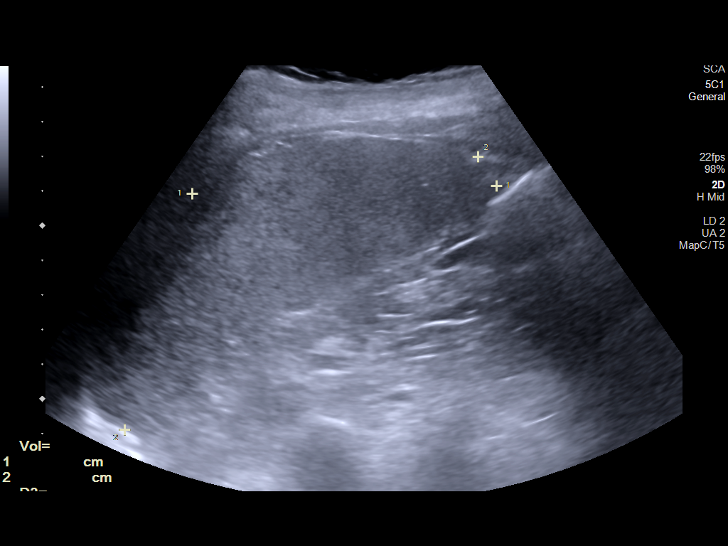
[im 14/18]
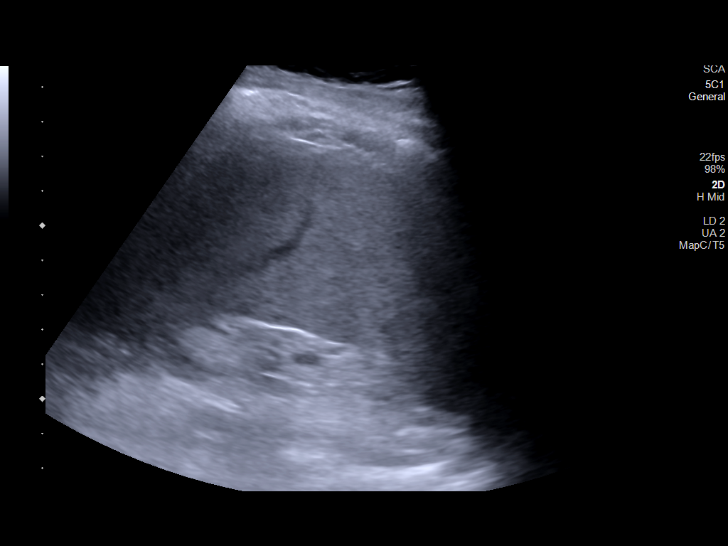
[im 15/18]
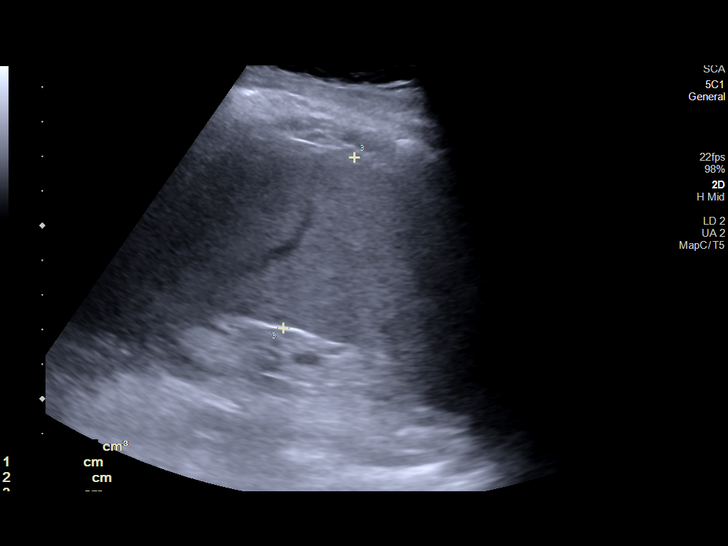
[im 17/18]
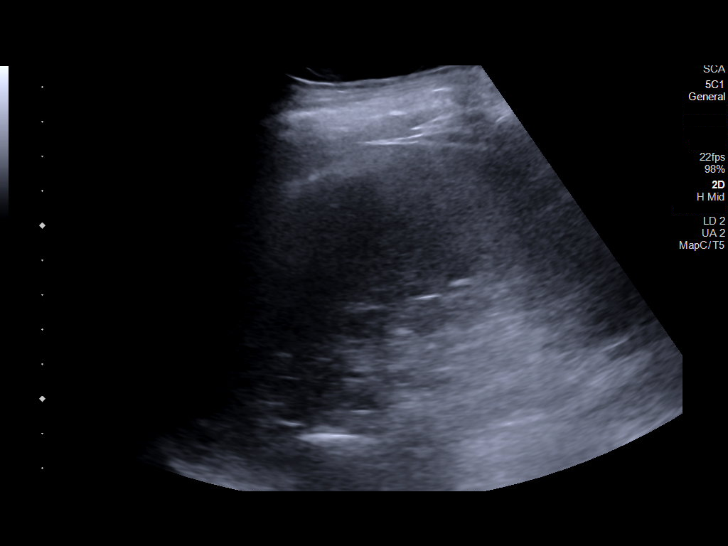
[im 18/18]
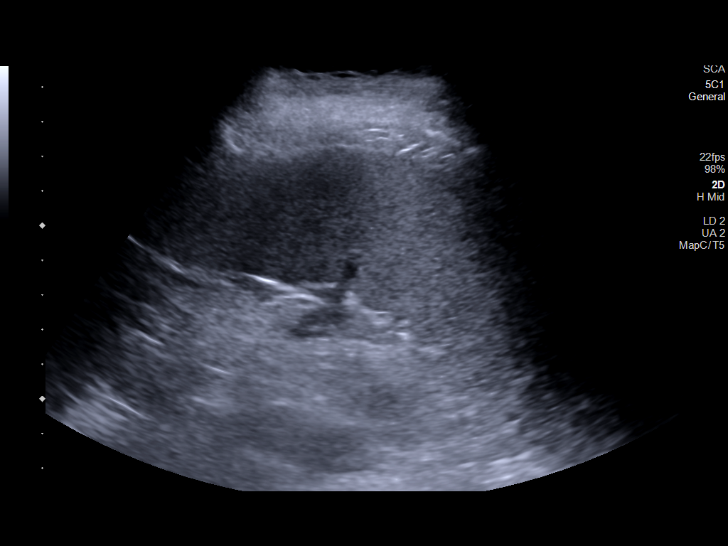

[14 of 18 positions shown; findings below may reference images not displayed]

FINDINGS: The spleen measures 12.9 x 8.8 x 5.3 cm with calculated volume of
317 cc which is not significantly changed compared to prior exam.
This is consistent with mild splenomegaly. Small focal calcification
is noted.
IMPRESSION: Small focal splenic calcification is noted. Grossly stable mild
splenomegaly.

## 2023-06-19 ENCOUNTER — Ambulatory Visit (HOSPITAL_BASED_OUTPATIENT_CLINIC_OR_DEPARTMENT_OTHER): Payer: Medicare PPO | Admitting: Physical Therapy

## 2023-06-19 ENCOUNTER — Encounter (HOSPITAL_BASED_OUTPATIENT_CLINIC_OR_DEPARTMENT_OTHER): Payer: Self-pay | Admitting: Physical Therapy

## 2023-06-19 DIAGNOSIS — M6281 Muscle weakness (generalized): Secondary | ICD-10-CM | POA: Diagnosis not present

## 2023-06-19 DIAGNOSIS — M25551 Pain in right hip: Secondary | ICD-10-CM | POA: Diagnosis not present

## 2023-06-19 DIAGNOSIS — M25552 Pain in left hip: Secondary | ICD-10-CM

## 2023-06-19 NOTE — Therapy (Signed)
OUTPATIENT PHYSICAL THERAPY LOWER EXTREMITY TREATMENT Progress note/ recert Progress Note Reporting Period 05/08/23 to 06/19/23  See note below for Objective Data and Assessment of Progress/Goals.       Patient Name: Catherine Fry MRN: [id number] DOB:23-Nov-1966, 57 y.o., female Today's Date: 06/19/2023  END OF SESSION:  PT End of Session - 06/19/23 1134     Visit Number 4    Number of Visits 12    Date for PT Re-Evaluation 07/12/23    Authorization Type [employer] MCR    PT Start Time 1130    PT Stop Time 1210    PT Time Calculation (min) 40 min    Activity Tolerance Patient tolerated treatment well    Behavior During Therapy WFL for tasks assessed/performed             Past Medical History:  Diagnosis Date   Atypical mole    moderte right upper buttock   Insomnia    Tachycardia    Past Surgical History:  Procedure Laterality Date   OOPHORECTOMY Right    TONSILLECTOMY     There are no problems to display for this patient.    REFERRING PROVIDER: Johnnette Litter MD  REFERRING DIAG: [phone] (ICD-10-CM) - Tendinopathy of gluteal region  THERAPY DIAG:  Pain in left hip  Pain in right hip  Muscle weakness (generalized)  Rationale for Evaluation and Treatment: Rehabilitation  ONSET DATE: chronic pain / early April 2024  SUBJECTIVE:   SUBJECTIVE STATEMENT: Pt reports pain in hip continues but it does tend to vary.  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 She does report an overall improvement in left hip pain since injections and the beginning of skilled therapy    From Eval:  Pt states she has been very active in her past, but has not been as active in the last 10-15 years.  She states she has put on some weight over the past 10-15 years.  Pt has a hx of lumbar injections due to having generalized pain in lumbar and bilat LE's.    Pt reports pain beginning 2.5 to 3 years ago.  Pt reports  having pain worsening about 7 weeks ago, but not sure why.  Pt has seen several MD's and received chiropractic care, PT, and massage therapy.  Pt states she researched her sx's and thought she had gluteal tendinopathy.  Pt went to see Dr. Allena Katz who agrees with pt.  Pt had x rays.  Pt has received cortisone shots in bilat hips.  Pt states meloxicam helps.  Pt states she is improving overall and able to sleep better.    PT order on 5/9 indicates bilat gluteal tendinopathy.  PT order also indicated to have pt participate in aquatic therapy to improve bilat LE proximal muscle strength, use other modalities, and provide pt with home exercise regimen.  Order also indicated R hip trochanteric bursitis.  Pt had PT approx 3-4 months ago.  Pt has a HEP though has not been performing her HEP.   Pt reports having pain in bilat glutes, lateral hips and thighs.  Pt reports having pain later after squatting to take care of her dog.  Pt has increased pain with bathing including turning to reach her side and legs.  Pt has pain with repetitive bending.  Pt avoids bending and lifting.  Pt is very limited with household chores and has someone to come and clean her  home.  Pt has pain with stairs.  She walks 20 mins every AM.  Pt has increased pain with walking up/down hills.     PERTINENT HISTORY: Chronic pain Tachycardia IBS  PAIN:  Are you having pain? Yes NPRS:  4/10 current, 6/10 with touch to area Location:  bilat thighs and lateral hips, L > R  PRECAUTIONS: None  WEIGHT BEARING RESTRICTIONS: No  FALLS:  Has patient fallen in last 6 months? No  LIVING ENVIRONMENT: Lives with: lives with their spouse Lives in: 2 story home Stairs: yes  OCCUPATION: Pt is retired   PLOF: Independent.  Pt has had chronic pain which has affected her daily activities and daily mobility.   PATIENT GOALS: strengthen core and LE's, improve pain, and to be able to function in everyday activities.    OBJECTIVE:    DIAGNOSTIC FINDINGS: Pt reports having x rays.  Unable to view x rays in Epic.    PATIENT SURVEYS:  FOTO 28 with a goal of 52 at visit #14 06/19/23: 54%  COGNITION: Overall cognitive status: Within functional limits for tasks assessed      PALPATION: TTP:  bilat glutes, GT  LOWER EXTREMITY ROM:  Active ROM Right eval Left eval  Hip flexion    Hip extension [facility]  Hip abduction [facility]  Hip adduction    Hip internal rotation    Hip external rotation    Knee flexion    Knee extension    Ankle dorsiflexion    Ankle plantarflexion    Ankle inversion    Ankle eversion     (Blank rows = not tested)  LOWER EXTREMITY MMT:  MMT Right eval Left eval Left 06/19/23  Hip flexion 5/5 4+/5 4+  Hip extension 5/5 4/5 4+  Hip abduction 5/5 4/5 4+  Hip adduction     Hip internal rotation 5/5 5/5 with pain   Hip external rotation 5/5 4+/5 4+/5  Knee flexion 5/5 5/5   Knee extension 5/5 5/5   Ankle dorsiflexion     Ankle plantarflexion     Ankle inversion     Ankle eversion      (Blank rows = not tested)  LOWER EXTREMITY SPECIAL TESTS:  Ober's test: negative bilat   GAIT: Assistive device utilized: None Level of assistance: Complete Independence Comments: Pt ambulates with a normalized gait pattern without limping.  Functional tests: 5x STS: 14.3 TUG:11.06  TODAY'S TREATMENT:                                                                                                                              Re-assessment TUG 5 x STS Balance testing: tandem x 20s    SLS x 10s    PATIENT EDUCATION:  Education details: aquatic therapy progressions/ modifications  Person educated: Patient Education method: Explanation Education comprehension: verbalized understanding  HOME EXERCISE PROGRAM: Pt has a land HEP.  Will give at a later date.  ASSESSMENT:  CLINICAL IMPRESSION: PN: Pt had a gap in services due to medical procedures she needed.  We have seen her in  aquatics x only 2 sessions.  She has had small improvements in pain and slight improvement in strength (see chart above) as well as also good improvement on Foto score. Pt is instructed on proper use of pain scale. pain current 4/10; worst 24 hour 7/10, least 2/10 and it varies. The overall decrease in pain may be attributed in part to hiring a house cleaning and eliminating doing yard work. She did not tolerate IASTM/ ktape from last session reporting increase in pain. Of Note: she appears to be slightly hypermobile in elbow and knees. She will benefit from continued aquatic therapy intervention to improve strength in left hip, encourage more consistent exercise of HEP in setting which she prefers (pool), decrease pain and progress towards land based goals.             OBJECTIVE IMPAIRMENTS: decreased activity tolerance, decreased mobility, decreased strength, increased fascial restrictions, and pain.   ACTIVITY LIMITATIONS: lifting, bending, squatting, stairs, and bathing, ambulating up/down hills  PARTICIPATION LIMITATIONS: cleaning  PERSONAL FACTORS: Time since onset of injury/illness/exacerbation are also affecting patient's functional outcome.   REHAB POTENTIAL: Good  CLINICAL DECISION MAKING: Stable/uncomplicated  EVALUATION COMPLEXITY: Low   GOALS:   SHORT TERM GOALS: Target date: 05/29/2023 (Adjusted to 06/28/23)  Pt will tolerate aquatic therapy without adverse effects for improved mobility, pain,strength, and function.  Baseline: Goal status: In progress 06/19/23  2.  Pt will report at least a 25% improvement in pain and sx's overall.   Baseline:  Goal status: INITIAL  3.  Pt will report improved tolerance with performing household chores.  Baseline:  Goal status: In progress 06/19/23     LONG TERM GOALS: Target date: 07/12/23  Pt will report she is able to bathe without increased pain.  Baseline:  Goal status: INITIAL  2.  Pt will be independent with land based and  aquatic HEP for improved strength, pain, and function.   Baseline:  Goal status: INITIAL  3.  Pt will report she is able to perform her walking program including ambulating up/down hills without significant pain and limitation.  Baseline:  Goal status: INITIAL  4.  Pt will demo improved L hip strength to 5/5 MMT for improved tolerance with and performance of functional mobility skills.  Baseline:  Goal status: INITIAL    PLAN:  PT FREQUENCY: 2x/week  PT DURATION:4 weeks  PLANNED INTERVENTIONS: Therapeutic exercises, Therapeutic activity, Neuromuscular re-education, Balance training, Gait training, Patient/Family education, Self Care, Joint mobilization, Stair training, Aquatic Therapy, Dry Needling, Electrical stimulation, Spinal mobilization, Cryotherapy, Moist heat, Taping, Ultrasound, Manual therapy, and Re-evaluation  PLAN FOR NEXT SESSION:  Brooke [place]) Jagger Beahm MPT 06/19/23 12:31 PM [facility] [address] [place], [place], [address] Phone: [phone]   Fax:  [phone]    Referring diagnosis? M67.959 (ICD-10-CM) - Tendinopathy of gluteal region  Treatment diagnosis? (if different than referring diagnosis)  Pain in left hip  Pain in right hip  Muscle weakness (generalized)  What was this (referring dx) caused by? []  Surgery []  Fall [x]  Ongoing issue []  Arthritis []  Other: ____________  Laterality: [x]  Rt [x]  Lt []  Both  Check all possible CPT codes:  *CHOOSE 10 OR LESS*    [x]  97110 (Therapeutic Exercise)  []  92507 (SLP Treatment)  [x]  97112 (Neuro Re-ed)   []  92526 (Swallowing Treatment)   [x]  97116 (  Gait Training)   []  [id number] (Cognitive Training, 1st 15 minutes) [x]  97140 (Manual Therapy)   []  97130 (Cognitive Training, each add'l 15 minutes)  [x]  97164 (Re-evaluation)                              []  Other, List CPT Code ____________  [x]  97530 (Therapeutic Activities)     [x]  97535  (Self Care)   []  All codes above (97110 - 97535)  []  97012 (Mechanical Traction)  [x]  97014 (E-stim Unattended)  [x]  97032 (E-stim manual)  []  40981 (Ionto)  [x]  97035 (Ultrasound) [x]  97750 (Physical Performance Training) [x]  [id number] (Aquatic Therapy) []  97016 (Vasopneumatic Device) []  [id number] (Paraffin) []  97034 (Contrast Bath) []  97597 (Wound Care 1st 20 sq cm) []  97598 (Wound Care each add'l 20 sq cm) []  97760 (Orthotic Fabrication, Fitting, Training Initial) []  [id number] (Prosthetic Management and Training Initial) []  [id number] (Orthotic or Prosthetic Training/ Modification Subsequent)

## 2023-06-21 ENCOUNTER — Ambulatory Visit (HOSPITAL_BASED_OUTPATIENT_CLINIC_OR_DEPARTMENT_OTHER): Payer: Medicare PPO | Admitting: Physical Therapy

## 2023-06-21 ENCOUNTER — Encounter (HOSPITAL_BASED_OUTPATIENT_CLINIC_OR_DEPARTMENT_OTHER): Payer: Self-pay | Admitting: Physical Therapy

## 2023-06-21 DIAGNOSIS — M6281 Muscle weakness (generalized): Secondary | ICD-10-CM

## 2023-06-21 DIAGNOSIS — M25552 Pain in left hip: Secondary | ICD-10-CM

## 2023-06-21 DIAGNOSIS — M25551 Pain in right hip: Secondary | ICD-10-CM | POA: Diagnosis not present

## 2023-06-21 NOTE — Therapy (Signed)
OUTPATIENT PHYSICAL THERAPY LOWER EXTREMITY TREATMENT     Patient Name: Catherine Fry MRN: [id number] DOB:March 26, 1966, 57 y.o., female Today's Date: 06/21/2023  END OF SESSION:  PT End of Session - 06/21/23 1239     Visit Number 5    Number of Visits 12    Date for PT Re-Evaluation 07/12/23    Authorization Type [employer] MCR    PT Start Time 1201    PT Stop Time 1245    PT Time Calculation (min) 44 min    Activity Tolerance Patient tolerated treatment well    Behavior During Therapy WFL for tasks assessed/performed              Past Medical History:  Diagnosis Date   Atypical mole    moderte right upper buttock   Insomnia    Tachycardia    Past Surgical History:  Procedure Laterality Date   OOPHORECTOMY Right    TONSILLECTOMY     There are no problems to display for this patient.    REFERRING PROVIDER: Johnnette Litter MD  REFERRING DIAG: [id number] (ICD-10-CM) - Tendinopathy of gluteal region  THERAPY DIAG:  Pain in left hip  Pain in right hip  Muscle weakness (generalized)  Rationale for Evaluation and Treatment: Rehabilitation  ONSET DATE: chronic pain / early April 2024  SUBJECTIVE:   SUBJECTIVE STATEMENT: "Doing ok"    From Eval:  Pt states she has been very active in her past, but has not been as active in the last 10-15 years.  She states she has put on some weight over the past 10-15 years.  Pt has a hx of lumbar injections due to having generalized pain in lumbar and bilat LE's.    Pt reports pain beginning 2.5 to 3 years ago.  Pt reports having pain worsening about 7 weeks ago, but not sure why.  Pt has seen several MD's and received chiropractic care, PT, and massage therapy.  Pt states she researched her sx's and thought she had gluteal tendinopathy.  Pt went to see Dr. Allena Katz who agrees with pt.  Pt had x rays.  Pt has received cortisone shots in bilat hips.  Pt states meloxicam helps.  Pt states she is improving overall and able to sleep  better.    PT order on 5/9 indicates bilat gluteal tendinopathy.  PT order also indicated to have pt participate in aquatic therapy to improve bilat LE proximal muscle strength, use other modalities, and provide pt with home exercise regimen.  Order also indicated R hip trochanteric bursitis.  Pt had PT approx 3-4 months ago.  Pt has a HEP though has not been performing her HEP.   Pt reports having pain in bilat glutes, lateral hips and thighs.  Pt reports having pain later after squatting to take care of her dog.  Pt has increased pain with bathing including turning to reach her side and legs.  Pt has pain with repetitive bending.  Pt avoids bending and lifting.  Pt is very limited with household chores and has someone to come and clean her home.  Pt has pain with stairs.  She walks 20 mins every AM.  Pt has increased pain with walking up/down hills.     PERTINENT HISTORY: Chronic pain Tachycardia IBS  PAIN:  Are you having pain? Yes NPRS:  2/10 current, 6/10 with touch to area Location:  bilat thighs and lateral hips, L > R  PRECAUTIONS: None  WEIGHT BEARING RESTRICTIONS: No  FALLS:  Has patient fallen in last 6 months? No  LIVING ENVIRONMENT: Lives with: lives with their spouse Lives in: 2 story home Stairs: yes  OCCUPATION: Pt is retired   PLOF: Independent.  Pt has had chronic pain which has affected her daily activities and daily mobility.   PATIENT GOALS: strengthen core and LE's, improve pain, and to be able to function in everyday activities.    OBJECTIVE:   DIAGNOSTIC FINDINGS: Pt reports having x rays.  Unable to view x rays in Epic.    PATIENT SURVEYS:  FOTO 28 with a goal of 52 at visit #14 06/19/23: 54%  COGNITION: Overall cognitive status: Within functional limits for tasks assessed      PALPATION: TTP:  bilat glutes, GT  LOWER EXTREMITY ROM:  Active ROM Right eval Left eval  Hip flexion    Hip extension [facility]  Hip abduction [facility]  Hip  adduction    Hip internal rotation    Hip external rotation    Knee flexion    Knee extension    Ankle dorsiflexion    Ankle plantarflexion    Ankle inversion    Ankle eversion     (Blank rows = not tested)  LOWER EXTREMITY MMT:  MMT Right eval Left eval Left 06/19/23  Hip flexion 5/5 4+/5 4+  Hip extension 5/5 4/5 4+  Hip abduction 5/5 4/5 4+  Hip adduction     Hip internal rotation 5/5 5/5 with pain   Hip external rotation 5/5 4+/5 4+/5  Knee flexion 5/5 5/5   Knee extension 5/5 5/5   Ankle dorsiflexion     Ankle plantarflexion     Ankle inversion     Ankle eversion      (Blank rows = not tested)  LOWER EXTREMITY SPECIAL TESTS:  Ober's test: negative bilat   GAIT: Assistive device utilized: None Level of assistance: Complete Independence Comments: Pt ambulates with a normalized gait pattern without limping.  Functional tests: 5x STS: 14.3 TUG:11.06  TODAY'S TREATMENT:                                                                                                                               Pt seen for aquatic therapy today.  Treatment took place in water 3.5-4.75 ft in depth at the [facility] pool. Temp of water was 91.  Pt entered/exited the pool via stairs independently in step through pattern with bilat rail.   * unsupported: walking forward / backward  side stepping *L stretch; with tail wagging * UE support on wall 3.6 ft:  hip openers 2x10; hip crosses 2 x 5; squats with glut isometric x 10 *UE support yellow ZO:XWRUEA leg swings into hip flex to hip ext (toe touch) 2 x 5 *Step ups leading R/L 2 x 5 *hip hiking  R/L bottom step x 10 *TrA sets: solid noodle pull down wide stance then staggered x 10 ea. *Hb carry yellow forward  and back x 2 widths ea * straddling noodle, without UE support:  cycling; hip add/abd *Walking forward and back between exercises for recovery   Pt requires the buoyancy and hydrostatic pressure of water for support,  and to offload joints by unweighting joint load by at least 50 % in navel deep water and by at least 75-80% in chest to neck deep water.  Viscosity of the water is needed for resistance of strengthening. Water current perturbations provides challenge to standing balance requiring increased core activation.    PATIENT EDUCATION:  Education details: aquatic therapy progressions/ modifications  Person educated: Patient Education method: Explanation Education comprehension: verbalized understanding  HOME EXERCISE PROGRAM: Pt has a land HEP.  Will give at a later date.  ASSESSMENT:  CLINICAL IMPRESSION: Pt requires cueing and demonstration for execution of exercises. She  tolerates sets doses of 5 reps of exercises with less discomfort in glute/hip areas today. Improvement in pain from last session. Consider pelvic tilting on noodle next session. Goals ongoing    PN: Pt had a gap in services due to medical procedures she needed.  We have seen her in aquatics x only 2 sessions.  She has had small improvements in pain and slight improvement in strength (see chart above) as well as also good improvement on Foto score. Pt is instructed on proper use of pain scale. pain current 4/10; worst 24 hour 7/10, least 2/10 and it varies. The overall decrease in pain may be attributed in part to hiring a house cleaning and eliminating doing yard work. She did not tolerate IASTM/ ktape from last session reporting increase in pain. Of Note: she appears to be slightly hypermobile in elbow and knees. She will benefit from continued aquatic therapy intervention to improve strength in left hip, encourage more consistent exercise of HEP in setting which she prefers (pool), decrease pain and progress towards land based goals.             OBJECTIVE IMPAIRMENTS: decreased activity tolerance, decreased mobility, decreased strength, increased fascial restrictions, and pain.   ACTIVITY LIMITATIONS: lifting, bending,  squatting, stairs, and bathing, ambulating up/down hills  PARTICIPATION LIMITATIONS: cleaning  PERSONAL FACTORS: Time since onset of injury/illness/exacerbation are also affecting patient's functional outcome.   REHAB POTENTIAL: Good  CLINICAL DECISION MAKING: Stable/uncomplicated  EVALUATION COMPLEXITY: Low   GOALS:   SHORT TERM GOALS: Target date: 05/29/2023 (Adjusted to 06/28/23)  Pt will tolerate aquatic therapy without adverse effects for improved mobility, pain,strength, and function.  Baseline: Goal status: In progress 06/19/23  2.  Pt will report at least a 25% improvement in pain and sx's overall.   Baseline:  Goal status: INITIAL  3.  Pt will report improved tolerance with performing household chores.  Baseline:  Goal status: In progress 06/19/23     LONG TERM GOALS: Target date: 07/12/23  Pt will report she is able to bathe without increased pain.  Baseline:  Goal status: INITIAL  2.  Pt will be independent with land based and aquatic HEP for improved strength, pain, and function.   Baseline:  Goal status: INITIAL  3.  Pt will report she is able to perform her walking program including ambulating up/down hills without significant pain and limitation.  Baseline:  Goal status: INITIAL  4.  Pt will demo improved L hip strength to 5/5 MMT for improved tolerance with and performance of functional mobility skills.  Baseline:  Goal status: INITIAL    PLAN:  PT FREQUENCY: 2x/week  PT DURATION:4  weeks  PLANNED INTERVENTIONS: Therapeutic exercises, Therapeutic activity, Neuromuscular re-education, Balance training, Gait training, Patient/Family education, Self Care, Joint mobilization, Stair training, Aquatic Therapy, Dry Needling, Electrical stimulation, Spinal mobilization, Cryotherapy, Moist heat, Taping, Ultrasound, Manual therapy, and Re-evaluation  PLAN FOR NEXT SESSION:  Brooke Pace Colp) Myreon Wimer MPT 06/21/23 12:40 PM [facility]  [facility] [address] [place], [place], [address] Phone: [phone]   Fax:  [phone]    Referring diagnosis? M67.959 (ICD-10-CM) - Tendinopathy of gluteal region  Treatment diagnosis? (if different than referring diagnosis)  Pain in left hip  Pain in right hip  Muscle weakness (generalized)  What was this (referring dx) caused by? []  Surgery []  Fall [x]  Ongoing issue []  Arthritis []  Other: ____________  Laterality: [x]  Rt [x]  Lt []  Both  Check all possible CPT codes:  *CHOOSE 10 OR LESS*    [x]  97110 (Therapeutic Exercise)  []  92507 (SLP Treatment)  [x]  97112 (Neuro Re-ed)   []  92526 (Swallowing Treatment)   [x]  65784 (Gait Training)   []  [id number] (Cognitive Training, 1st 15 minutes) [x]  97140 (Manual Therapy)   []  97130 (Cognitive Training, each add'l 15 minutes)  [x]  97164 (Re-evaluation)                              []  Other, List CPT Code ____________  [x]  97530 (Therapeutic Activities)     [x]  97535 (Self Care)   []  All codes above (97110 - 97535)  []  69629 (Mechanical Traction)  [x]  97014 (E-stim Unattended)  [x]  97032 (E-stim manual)  []  97033 (Ionto)  [x]  97035 (Ultrasound) [x]  97750 (Physical Performance Training) [x]  [id number] (Aquatic Therapy) []  97016 (Vasopneumatic Device) []  [id number] (Paraffin) []  97034 (Contrast Bath) []  97597 (Wound Care 1st 20 sq cm) []  97598 (Wound Care each add'l 20 sq cm) []  97760 (Orthotic Fabrication, Fitting, Training Initial) []  [id number] (Prosthetic Management and Training Initial) []  [id number] (Orthotic or Prosthetic Training/ Modification Subsequent)

## 2023-07-02 ENCOUNTER — Ambulatory Visit (HOSPITAL_BASED_OUTPATIENT_CLINIC_OR_DEPARTMENT_OTHER): Payer: Medicare PPO | Admitting: Physical Therapy

## 2023-07-02 ENCOUNTER — Encounter (HOSPITAL_BASED_OUTPATIENT_CLINIC_OR_DEPARTMENT_OTHER): Payer: Self-pay

## 2023-07-04 ENCOUNTER — Ambulatory Visit
Admission: EM | Admit: 2023-07-04 | Discharge: 2023-07-04 | Disposition: A | Discharge status: Home / Self Care | Payer: Medicare PPO | Attending: Family Medicine | Admitting: Family Medicine

## 2023-07-04 ENCOUNTER — Encounter: Payer: Self-pay | Admitting: *Deleted

## 2023-07-04 ENCOUNTER — Other Ambulatory Visit: Payer: Self-pay

## 2023-07-04 DIAGNOSIS — R051 Acute cough: Secondary | ICD-10-CM

## 2023-07-04 DIAGNOSIS — J0110 Acute frontal sinusitis, unspecified: Secondary | ICD-10-CM

## 2023-07-04 HISTORY — DX: Unspecified disorder of synovium and tendon, unspecified thigh: M67.959

## 2023-07-04 LAB — POC SARS CORONAVIRUS 2 AG -  ED: SARS Coronavirus 2 Ag: NEGATIVE

## 2023-07-04 LAB — POCT RAPID STREP A (OFFICE): Rapid Strep A Screen: NEGATIVE

## 2023-07-04 MED ORDER — AMOXICILLIN-POT CLAVULANATE 875-125 MG PO TABS: 1.0000 | ORAL_TABLET | Freq: Two times a day (BID) | ORAL | 0 refills | Status: DC

## 2023-07-04 NOTE — ED Triage Notes (Addendum)
C/O sinus pain in entire face, nasal congestion, sore throat onset approx 1 wk ago. Has been taking Mucinex. Now c/o having a productive cough with clear sputum, and pt is becoming concerned for strep throat and is also requesting Covid test.

## 2023-07-04 NOTE — ED Provider Notes (Signed)
[facility]    CSN: [id number] Arrival date & time: 07/04/23  1044      History   Chief Complaint Chief Complaint  Patient presents with   Cough   Sore Throat    HPI Catherine Fry is a 57 y.o. female.   HPI  Patient states that she is prone to infections and thinks that she has one now.  She states her entire face feels full, especially up behind her eyes and in her forehead.  She has nasal congestion.  Yellow-green mucus.  Sore throat.  She has had some headaches and body aches.  She desires strep testing and COVID testing.  Both of these are negative.  She has been taking over-the-counter medicines for just over a week.  Past Medical History:  Diagnosis Date   Atypical mole    moderte right upper buttock   Insomnia    Tachycardia    Tendinopathy of gluteal region     There are no problems to display for this patient.   Past Surgical History:  Procedure Laterality Date   OOPHORECTOMY Right    TONSILLECTOMY      OB History   No obstetric history on file.      Home Medications    Prior to Admission medications   Medication Sig Start Date End Date Taking? Authorizing Provider  amoxicillin-clavulanate (AUGMENTIN) 875-125 MG tablet Take 1 tablet by mouth every 12 (twelve) hours. 07/04/23  Yes Eustace Moore, MD  clonazePAM (KLONOPIN) 1 MG tablet Take 2 mg by mouth at bedtime.   Yes [provider]  gabapentin (NEURONTIN) 300 MG capsule Take 1,200 mg by mouth at bedtime.   Yes [provider]  lamoTRIgine (LAMICTAL) 200 MG tablet Take 200 mg by mouth daily.   Yes [provider]  metoprolol succinate (TOPROL-XL) 25 MG 24 hr tablet Take 1 tablet by mouth daily. 04/25/21  Yes [provider]  TURMERIC PO Take by mouth.   Yes [provider]    Family History Family History  Problem Relation Age of Onset   Cancer Mother        breast   Lymphoma Father    Alzheimer's disease Father     Social  History Social History   Tobacco Use   Smoking status: Never   Smokeless tobacco: Never  Vaping Use   Vaping status: Never Used  Substance Use Topics   Alcohol use: Yes    Comment: occasionally   Drug use: No     Allergies   Minocycline   Review of Systems Review of Systems See HPI  Physical Exam Triage Vital Signs ED Triage Vitals  Encounter Vitals Group     BP 07/04/23 1103 (!) 143/86     Systolic BP Percentile --      Diastolic BP Percentile --      Pulse Rate 07/04/23 1103 88     Resp 07/04/23 1103 16     Temp 07/04/23 1103 98.2 F (36.8 C)     Temp Source 07/04/23 1103 Oral     SpO2 07/04/23 1103 100 %     Weight --      Height --      Head Circumference --      Peak Flow --      Pain Score 07/04/23 1105 8     Pain Loc --      Pain Education --      Exclude from Growth Chart --  No data found.  Updated Vital Signs BP (!) 143/86   Pulse 88   Temp 98.2 F (36.8 C) (Oral)   Resp 16   SpO2 100%       Physical Exam Constitutional:      General: She is not in acute distress.    Appearance: She is well-developed. She is ill-appearing.  HENT:     Head: Normocephalic and atraumatic.     Right Ear: Tympanic membrane and ear canal normal.     Left Ear: Tympanic membrane and ear canal normal.     Ears:     Comments: Tender facial sinuses ethmoid and frontal    Nose: Congestion and rhinorrhea present.     Mouth/Throat:     Mouth: Mucous membranes are moist.     Pharynx: No posterior oropharyngeal erythema.  Eyes:     Conjunctiva/sclera: Conjunctivae normal.     Pupils: Pupils are equal, round, and reactive to light.  Cardiovascular:     Rate and Rhythm: Normal rate and regular rhythm.     Heart sounds: Normal heart sounds.  Pulmonary:     Effort: Pulmonary effort is normal. No respiratory distress.     Breath sounds: Normal breath sounds.  Abdominal:     General: There is no distension.     Palpations: Abdomen is soft.  Musculoskeletal:         General: Normal range of motion.     Cervical back: Normal range of motion.  Lymphadenopathy:     Cervical: Cervical adenopathy present.  Skin:    General: Skin is warm and dry.  Neurological:     Mental Status: She is alert.      UC Treatments / Results  Labs (all labs ordered are listed, but only abnormal results are displayed) Labs Reviewed  POCT RAPID STREP A (OFFICE)  POC SARS CORONAVIRUS 2 AG -  ED    EKG   Radiology No results found.  Procedures Procedures (including critical care time)  Medications Ordered in UC Medications - No data to display  Initial Impression / Assessment and Plan / UC Course  I have reviewed the triage vital signs and the nursing notes.  Pertinent labs & imaging results that were available during my care of the patient were reviewed by me and considered in my medical decision making (see chart for details).     Final Clinical Impressions(s) / UC Diagnoses   Final diagnoses:  Acute non-recurrent frontal sinusitis  Acute cough     Discharge Instructions      Take the antibiotic 2 x a day Take with food Consider a probiotic with the antibiotic to protect your stomach Continue mucinex Drink lots of water   ED Prescriptions     Medication Sig Dispense Auth. Provider   amoxicillin-clavulanate (AUGMENTIN) 875-125 MG tablet Take 1 tablet by mouth every 12 (twelve) hours. 14 tablet Eustace Moore, MD      PDMP not reviewed this encounter.   Eustace Moore, MD 07/04/23 [phone]

## 2023-07-04 NOTE — Discharge Instructions (Addendum)
Take the antibiotic 2 x a day Take with food Consider a probiotic with the antibiotic to protect your stomach Continue mucinex Drink lots of water

## 2023-07-05 ENCOUNTER — Ambulatory Visit (HOSPITAL_BASED_OUTPATIENT_CLINIC_OR_DEPARTMENT_OTHER): Payer: Medicare PPO | Admitting: Physical Therapy

## 2023-07-09 ENCOUNTER — Encounter (HOSPITAL_BASED_OUTPATIENT_CLINIC_OR_DEPARTMENT_OTHER): Payer: Self-pay | Admitting: Physical Therapy

## 2023-07-09 ENCOUNTER — Ambulatory Visit (HOSPITAL_BASED_OUTPATIENT_CLINIC_OR_DEPARTMENT_OTHER): Payer: Medicare PPO | Admitting: Physical Therapy

## 2023-07-09 ENCOUNTER — Encounter (HOSPITAL_BASED_OUTPATIENT_CLINIC_OR_DEPARTMENT_OTHER): Payer: Self-pay

## 2023-07-09 DIAGNOSIS — M25552 Pain in left hip: Secondary | ICD-10-CM

## 2023-07-09 DIAGNOSIS — M6281 Muscle weakness (generalized): Secondary | ICD-10-CM

## 2023-07-09 DIAGNOSIS — M25551 Pain in right hip: Secondary | ICD-10-CM

## 2023-07-09 NOTE — Therapy (Signed)
[facility] [address] [place], [place], [address] Phone: [phone]   Fax:  [phone]  Patient Details  Name: Catherine Fry MRN: [id number] Date of Birth: 1966-06-25 Referring Provider:  Bernerd Pho, MD  Encounter Date: 07/09/2023  Patient arrived, but was not treated or charged.   Patient reports she is being treated for a sinus infection and doesn't feel well.  She states she has been diagnosed with asthma and the chlorine is not good for her.  She does not currently have access to pool and does not foresee gaining access after d/c.  She requests to be done with aquatic exercises.  Patient is interested in learning some land exercises she can do at home.  Deferred treatment due to her not feeling well, and not having clothes for land appointment.   Cancelled today's appointment and changed Friday appointment from Aquatic therapy to land appointment. Informed pt it is the end of her POC; will discuss with PT on Friday whether she will continue or d/c after 07/12/23.   Mayer Camel, PTA 07/09/23 12:26 PM [facility]

## 2023-07-10 DIAGNOSIS — R051 Acute cough: Secondary | ICD-10-CM | POA: Diagnosis not present

## 2023-07-10 DIAGNOSIS — Z6827 Body mass index (BMI) 27.0-27.9, adult: Secondary | ICD-10-CM | POA: Diagnosis not present

## 2023-07-11 DIAGNOSIS — M7062 Trochanteric bursitis, left hip: Secondary | ICD-10-CM | POA: Diagnosis not present

## 2023-07-11 DIAGNOSIS — M7061 Trochanteric bursitis, right hip: Secondary | ICD-10-CM | POA: Diagnosis not present

## 2023-07-12 ENCOUNTER — Ambulatory Visit (HOSPITAL_BASED_OUTPATIENT_CLINIC_OR_DEPARTMENT_OTHER): Payer: Medicare PPO | Admitting: Physical Therapy

## 2023-07-12 ENCOUNTER — Encounter (HOSPITAL_BASED_OUTPATIENT_CLINIC_OR_DEPARTMENT_OTHER): Payer: Self-pay | Admitting: Physical Therapy

## 2023-07-12 ENCOUNTER — Ambulatory Visit (HOSPITAL_BASED_OUTPATIENT_CLINIC_OR_DEPARTMENT_OTHER)
Payer: Medicare PPO | Attending: Student in an Organized Health Care Education/Training Program | Admitting: Physical Therapy

## 2023-07-12 DIAGNOSIS — M25551 Pain in right hip: Secondary | ICD-10-CM | POA: Diagnosis not present

## 2023-07-12 DIAGNOSIS — M6281 Muscle weakness (generalized): Secondary | ICD-10-CM | POA: Insufficient documentation

## 2023-07-12 DIAGNOSIS — M25552 Pain in left hip: Secondary | ICD-10-CM | POA: Diagnosis not present

## 2023-07-12 NOTE — Therapy (Signed)
OUTPATIENT PHYSICAL THERAPY LOWER EXTREMITY TREATMENT/Progress note      Patient Name: Catherine Fry MRN: [id number] DOB:Nov 12, 1966, 57 y.o., female Today's Date: 07/17/2023  END OF SESSION:     Past Medical History:  Diagnosis Date   Atypical mole    moderte right upper buttock   Insomnia    Tachycardia    Tendinopathy of gluteal region    Past Surgical History:  Procedure Laterality Date   OOPHORECTOMY Right    TONSILLECTOMY     There are no problems to display for this patient.    REFERRING PROVIDER: Johnnette Litter MD  REFERRING DIAG: [phone] (ICD-10-CM) - Tendinopathy of gluteal region  THERAPY DIAG:  Pain in left hip  Pain in right hip  Muscle weakness (generalized)  Rationale for Evaluation and Treatment: Rehabilitation  ONSET DATE: chronic pain / early April 2024  SUBJECTIVE:   SUBJECTIVE STATEMENT: "Doing ok"    From Eval:  Pt states she has been very active in her past, but has not been as active in the last 10-15 years.  She states she has put on some weight over the past 10-15 years.  Pt has a hx of lumbar injections due to having generalized pain in lumbar and bilat LE's.    Pt reports pain beginning 2.5 to 3 years ago.  Pt reports having pain worsening about 7 weeks ago, but not sure why.  Pt has seen several MD's and received chiropractic care, PT, and massage therapy.  Pt states she researched her sx's and thought she had gluteal tendinopathy.  Pt went to see Dr. Allena Katz who agrees with pt.  Pt had x rays.  Pt has received cortisone shots in bilat hips.  Pt states meloxicam helps.  Pt states she is improving overall and able to sleep better.    PT order on 5/9 indicates bilat gluteal tendinopathy.  PT order also indicated to have pt participate in aquatic therapy to improve bilat LE proximal muscle strength, use other modalities, and provide pt with home exercise regimen.  Order also indicated R hip trochanteric bursitis.  Pt had PT approx 3-4  months ago.  Pt has a HEP though has not been performing her HEP.   Pt reports having pain in bilat glutes, lateral hips and thighs.  Pt reports having pain later after squatting to take care of her dog.  Pt has increased pain with bathing including turning to reach her side and legs.  Pt has pain with repetitive bending.  Pt avoids bending and lifting.  Pt is very limited with household chores and has someone to come and clean her home.  Pt has pain with stairs.  She walks 20 mins every AM.  Pt has increased pain with walking up/down hills.     PERTINENT HISTORY: Chronic pain Tachycardia IBS  PAIN:  Are you having pain? Yes NPRS:  2/10 current, 6/10 with touch to area Location:  bilat thighs and lateral hips, L > R  PRECAUTIONS: None  WEIGHT BEARING RESTRICTIONS: No  FALLS:  Has patient fallen in last 6 months? No  LIVING ENVIRONMENT: Lives with: lives with their spouse Lives in: 2 story home Stairs: yes  OCCUPATION: Pt is retired   PLOF: Independent.  Pt has had chronic pain which has affected her daily activities and daily mobility.   PATIENT GOALS: strengthen core and LE's, improve pain, and to be able to function in everyday activities.    OBJECTIVE:   DIAGNOSTIC FINDINGS: Pt reports having x  rays.  Unable to view x rays in Epic.    PATIENT SURVEYS:  FOTO 28 with a goal of 52 at visit #14 06/19/23: 54%  COGNITION: Overall cognitive status: Within functional limits for tasks assessed      PALPATION: TTP:  bilat glutes, GT  LOWER EXTREMITY ROM:  Active ROM Right eval Left eval  Hip flexion    Hip extension [facility]  Hip abduction [facility]  Hip adduction    Hip internal rotation    Hip external rotation    Knee flexion    Knee extension    Ankle dorsiflexion    Ankle plantarflexion    Ankle inversion    Ankle eversion     (Blank rows = not tested)  LOWER EXTREMITY MMT:  MMT Right eval Left eval Left 06/19/23 Right 8/2 Left 8/2  Hip flexion  5/5 4+/5 4+ 22.3 15.9  Hip extension 5/5 4/5 4+    Hip abduction 5/5 4/5 4+ 31 29.7  Hip adduction       Hip internal rotation 5/5 5/5 with pain     Hip external rotation 5/5 4+/5 4+/5    Knee flexion 5/5 5/5     Knee extension 5/5 5/5  30.6 29.2  Ankle dorsiflexion       Ankle plantarflexion       Ankle inversion       Ankle eversion        (Blank rows = not tested)  LOWER EXTREMITY SPECIAL TESTS:  Ober's test: negative bilat   GAIT: Assistive device utilized: None Level of assistance: Complete Independence Comments: Pt ambulates with a normalized gait pattern without limping.  Functional tests: 5x STS: 14.3 TUG:11.06  TODAY'S TREATMENT:                                                                                                                               8/2 LTR x20  Self trigger point release with the ball. Skilled palpation with the trigger point.  Right gluteal stretching 3 x 20-second hold  Supine march with education on progression x 20 each leg Bridge with education on progression 2 x 10 Supine hip abduction 2 x 20 yellow band would benefit most from red to green band but no more available in the clinic.  Reviewed HEP and how to use it.  Reviewed goals and strength measurements     Last visit: Pt seen for aquatic therapy today.  Treatment took place in water 3.5-4.75 ft in depth at the [facility] pool. Temp of water was 91.  Pt entered/exited the pool via stairs independently in step through pattern with bilat rail.   * unsupported: walking forward / backward  side stepping *L stretch; with tail wagging * UE support on wall 3.6 ft:  hip openers 2x10; hip crosses 2 x 5; squats with glut isometric x 10 *UE support yellow ZO:XWRUEA leg swings into hip flex to hip ext (toe  touch) 2 x 5 *Step ups leading R/L 2 x 5 *hip hiking  R/L bottom step x 10 *TrA sets: solid noodle pull down wide stance then staggered x 10 ea. *Hb carry yellow forward and  back x 2 widths ea * straddling noodle, without UE support:  cycling; hip add/abd *Walking forward and back between exercises for recovery   Pt requires the buoyancy and hydrostatic pressure of water for support, and to offload joints by unweighting joint load by at least 50 % in navel deep water and by at least 75-80% in chest to neck deep water.  Viscosity of the water is needed for resistance of strengthening. Water current perturbations provides challenge to standing balance requiring increased core activation.    PATIENT EDUCATION:  Education details: aquatic therapy progressions/ modifications  Person educated: Patient Education method: Explanation Education comprehension: verbalized understanding  HOME EXERCISE PROGRAM: Pt has a land HEP.  Will give at a later date.  ASSESSMENT:  CLINICAL IMPRESSION: The patient came in for her first land appointment today. We performed a re-assement on her. Her biggest strength deficit was in hip flexion. Her FOTO has stayed the same. She admits she hasn't been able to do much. She is motivated to develop a land plan and start working on her pool plan. She was given an HEP today. We worked on 3 light stretches and 3 exercises. We will give her a different series to work on next visit. She has a large trigger point in her gluteal. We may trial needling as well. She would benefit from further skilled therapy 2W6    PN: Pt had a gap in services due to medical procedures she needed.  We have seen her in aquatics x only 2 sessions.  She has had small improvements in pain and slight improvement in strength (see chart above) as well as also good improvement on Foto score. Pt is instructed on proper use of pain scale. pain current 4/10; worst 24 hour 7/10, least 2/10 and it varies. The overall decrease in pain may be attributed in part to hiring a house cleaning and eliminating doing yard work. She did not tolerate IASTM/ ktape from last session reporting  increase in pain. Of Note: she appears to be slightly hypermobile in elbow and knees. She will benefit from continued aquatic therapy intervention to improve strength in left hip, encourage more consistent exercise of HEP in setting which she prefers (pool), decrease pain and progress towards land based goals.             OBJECTIVE IMPAIRMENTS: decreased activity tolerance, decreased mobility, decreased strength, increased fascial restrictions, and pain.   ACTIVITY LIMITATIONS: lifting, bending, squatting, stairs, and bathing, ambulating up/down hills  PARTICIPATION LIMITATIONS: cleaning  PERSONAL FACTORS: Time since onset of injury/illness/exacerbation are also affecting patient's functional outcome.   REHAB POTENTIAL: Good  CLINICAL DECISION MAKING: Stable/uncomplicated  EVALUATION COMPLEXITY: Low   GOALS:   SHORT TERM GOALS: Target date: 05/29/2023 (Adjusted to 06/28/23)  Pt will tolerate aquatic therapy without adverse effects for improved mobility, pain,strength, and function.  Baseline: Goal status: In progress 06/19/23  2.  Pt will report at least a 50% improvement in pain and sx's overall.   Baseline:  Goal status: Improving about 25% goal achieved and revised 8/2  3.  Pt will report improved tolerance with performing household chores.  Baseline:  Goal status: In progress continues to progress 8/2     LONG TERM GOALS: Target date: 07/12/23  Pt will report  she is able to bathe without increased pain.  Baseline:  Goal status: progressing 8/2  2.  Pt will be independent with land based and aquatic HEP for improved strength, pain, and function.   Baseline:  Goal status: progressing 8/2  3.  Pt will report she is able to perform her walking program including ambulating up/down hills without significant pain and limitation.  Baseline:  Goal status: INITIAL  4.  Pt will demo improved L hip strength to 5/5 MMT for improved tolerance with and performance of functional  mobility skills.  Baseline:  Goal status: INITIAL    PLAN:  PT FREQUENCY: 2x/week  PT DURATION:6 weeks  PLANNED INTERVENTIONS: Therapeutic exercises, Therapeutic activity, Neuromuscular re-education, Balance training, Gait training, Patient/Family education, Self Care, Joint mobilization, Stair training, Aquatic Therapy, Dry Needling, Electrical stimulation, Spinal mobilization, Cryotherapy, Moist heat, Taping, Ultrasound, Manual therapy, and Re-evaluation  PLAN FOR NEXT SESSION:  Aquatics  Develop land program    07/17/23 8:35 AM [facility] [address] [place], [place], [address] Phone: [phone]   Fax:  [phone]    Referring diagnosis? M67.959 (ICD-10-CM) - Tendinopathy of gluteal region  Treatment diagnosis? (if different than referring diagnosis)  Pain in left hip  Pain in right hip  Muscle weakness (generalized)  What was this (referring dx) caused by? []  Surgery []  Fall [x]  Ongoing issue []  Arthritis []  Other: ____________  Laterality: [x]  Rt [x]  Lt []  Both  Check all possible CPT codes:  *CHOOSE 10 OR LESS*    [x]  59563 (Therapeutic Exercise)  []  92507 (SLP Treatment)  [x]  97112 (Neuro Re-ed)   []  92526 (Swallowing Treatment)   [x]  97116 (Gait Training)   []  [id number] (Cognitive Training, 1st 15 minutes) [x]  97140 (Manual Therapy)   []  97130 (Cognitive Training, each add'l 15 minutes)  [x]  97164 (Re-evaluation)                              []  Other, List CPT Code ____________  [x]  97530 (Therapeutic Activities)     [x]  97535 (Self Care)   []  All codes above (97110 - 97535)  []  97012 (Mechanical Traction)  [x]  97014 (E-stim Unattended)  [x]  97032 (E-stim manual)  []  97033 (Ionto)  [x]  97035 (Ultrasound) [x]  97750 (Physical Performance Training) [x]  [id number] (Aquatic Therapy) []  97016 (Vasopneumatic Device) []  [id number] (Paraffin) []  97034 (Contrast Bath) []  97597 (Wound Care 1st 20 sq cm) []   97598 (Wound Care each add'l 20 sq cm) []  97760 (Orthotic Fabrication, Fitting, Training Initial) []  [id number] (Prosthetic Management and Training Initial) []  [id number] (Orthotic or Prosthetic Training/ Modification Subsequent)

## 2023-07-17 ENCOUNTER — Encounter (HOSPITAL_BASED_OUTPATIENT_CLINIC_OR_DEPARTMENT_OTHER): Payer: Self-pay

## 2023-07-17 ENCOUNTER — Ambulatory Visit (HOSPITAL_BASED_OUTPATIENT_CLINIC_OR_DEPARTMENT_OTHER): Payer: Medicare PPO

## 2023-07-17 DIAGNOSIS — M25552 Pain in left hip: Secondary | ICD-10-CM

## 2023-07-17 DIAGNOSIS — M6281 Muscle weakness (generalized): Secondary | ICD-10-CM

## 2023-07-17 DIAGNOSIS — M25551 Pain in right hip: Secondary | ICD-10-CM | POA: Diagnosis not present

## 2023-07-17 NOTE — Therapy (Signed)
OUTPATIENT PHYSICAL THERAPY LOWER EXTREMITY TREATMENT    Patient Name: Catherine Fry MRN: [id number] DOB:1966/01/10, 57 y.o., female Today's Date: 07/17/2023  END OF SESSION:  PT End of Session - 07/17/23 1015     Visit Number 7    Number of Visits 12    Date for PT Re-Evaluation 08/28/23    Authorization Type [employer] MCR    PT Start Time 1015    PT Stop Time 1100    PT Time Calculation (min) 45 min    Activity Tolerance Patient tolerated treatment well    Behavior During Therapy WFL for tasks assessed/performed               Past Medical History:  Diagnosis Date   Atypical mole    moderte right upper buttock   Insomnia    Tachycardia    Tendinopathy of gluteal region    Past Surgical History:  Procedure Laterality Date   OOPHORECTOMY Right    TONSILLECTOMY     There are no problems to display for this patient.    REFERRING PROVIDER: Johnnette Litter MD  REFERRING DIAG: [phone] (ICD-10-CM) - Tendinopathy of gluteal region  THERAPY DIAG:  Muscle weakness (generalized)  Pain in right hip  Pain in left hip  Rationale for Evaluation and Treatment: Rehabilitation  ONSET DATE: chronic pain / early April 2024  SUBJECTIVE:   SUBJECTIVE STATEMENT: Pt reports L hip bothers her more than R side. Trying to perform HEP regularly. Did not have soreness after last PT session.    From Eval:  Pt states she has been very active in her past, but has not been as active in the last 10-15 years.  She states she has put on some weight over the past 10-15 years.  Pt has a hx of lumbar injections due to having generalized pain in lumbar and bilat LE's.    Pt reports pain beginning 2.5 to 3 years ago.  Pt reports having pain worsening about 7 weeks ago, but not sure why.  Pt has seen several MD's and received chiropractic care, PT, and massage therapy.  Pt states she researched her sx's and thought she had gluteal tendinopathy.  Pt went to see Dr. Allena Katz who agrees with pt.   Pt had x rays.  Pt has received cortisone shots in bilat hips.  Pt states meloxicam helps.  Pt states she is improving overall and able to sleep better.    PT order on 5/9 indicates bilat gluteal tendinopathy.  PT order also indicated to have pt participate in aquatic therapy to improve bilat LE proximal muscle strength, use other modalities, and provide pt with home exercise regimen.  Order also indicated R hip trochanteric bursitis.  Pt had PT approx 3-4 months ago.  Pt has a HEP though has not been performing her HEP.   Pt reports having pain in bilat glutes, lateral hips and thighs.  Pt reports having pain later after squatting to take care of her dog.  Pt has increased pain with bathing including turning to reach her side and legs.  Pt has pain with repetitive bending.  Pt avoids bending and lifting.  Pt is very limited with household chores and has someone to come and clean her home.  Pt has pain with stairs.  She walks 20 mins every AM.  Pt has increased pain with walking up/down hills.     PERTINENT HISTORY: Chronic pain Tachycardia IBS  PAIN:  Are you having pain? Yes NPRS:  2/10 current, 6/10 with touch to area Location:  bilat thighs and lateral hips, L > R  PRECAUTIONS: None  WEIGHT BEARING RESTRICTIONS: No  FALLS:  Has patient fallen in last 6 months? No  LIVING ENVIRONMENT: Lives with: lives with their spouse Lives in: 2 story home Stairs: yes  OCCUPATION: Pt is retired   PLOF: Independent.  Pt has had chronic pain which has affected her daily activities and daily mobility.   PATIENT GOALS: strengthen core and LE's, improve pain, and to be able to function in everyday activities.    OBJECTIVE:   DIAGNOSTIC FINDINGS: Pt reports having x rays.  Unable to view x rays in Epic.    PATIENT SURVEYS:  FOTO 28 with a goal of 52 at visit #14 06/19/23: 54%  COGNITION: Overall cognitive status: Within functional limits for tasks assessed      PALPATION: TTP:   bilat glutes, GT  LOWER EXTREMITY ROM:  Active ROM Right eval Left eval  Hip flexion    Hip extension [facility]  Hip abduction [facility]  Hip adduction    Hip internal rotation    Hip external rotation    Knee flexion    Knee extension    Ankle dorsiflexion    Ankle plantarflexion    Ankle inversion    Ankle eversion     (Blank rows = not tested)  LOWER EXTREMITY MMT:  MMT Right eval Left eval Left 06/19/23 Right 8/2 Left 8/2  Hip flexion 5/5 4+/5 4+ 22.3 15.9  Hip extension 5/5 4/5 4+    Hip abduction 5/5 4/5 4+ 31 29.7  Hip adduction       Hip internal rotation 5/5 5/5 with pain     Hip external rotation 5/5 4+/5 4+/5    Knee flexion 5/5 5/5     Knee extension 5/5 5/5  30.6 29.2  Ankle dorsiflexion       Ankle plantarflexion       Ankle inversion       Ankle eversion        (Blank rows = not tested)  LOWER EXTREMITY SPECIAL TESTS:  Ober's test: negative bilat   GAIT: Assistive device utilized: None Level of assistance: Complete Independence Comments: Pt ambulates with a normalized gait pattern without limping.  Functional tests: 5x STS: 14.3 TUG:11.06  TODAY'S TREATMENT:                                                                                                                                8/7  Manual stretch of piriformis/glute, HS, and ITB bilaterally LTR x15  Bridge 3x10  Supine hip abduction x30 with RTB (cues for active glute and core engagement) Prone hip extension with knee flexed (cues for small range to avoid pain) x10ea  Prone ER isometric with ball- 3" x10 (some pain)  Quadruped alternating UE raise with core activation x10ea    8/2 LTR x20  Self trigger  point release with the ball. Skilled palpation with the trigger point.  Right gluteal stretching 3 x 20-second hold  Supine march with education on progression x 20 each leg Bridge with education on progression 2 x 10 Supine hip abduction 2 x 20 yellow band would benefit most  from red to green band but no more available in the clinic.  Reviewed HEP and how to use it.  Reviewed goals and strength measurements     Last visit: Pt seen for aquatic therapy today.  Treatment took place in water 3.5-4.75 ft in depth at the [facility] pool. Temp of water was 91.  Pt entered/exited the pool via stairs independently in step through pattern with bilat rail.   * unsupported: walking forward / backward  side stepping *L stretch; with tail wagging * UE support on wall 3.6 ft:  hip openers 2x10; hip crosses 2 x 5; squats with glut isometric x 10 *UE support yellow UE:AVWUJW leg swings into hip flex to hip ext (toe touch) 2 x 5 *Step ups leading R/L 2 x 5 *hip hiking  R/L bottom step x 10 *TrA sets: solid noodle pull down wide stance then staggered x 10 ea. *Hb carry yellow forward and back x 2 widths ea * straddling noodle, without UE support:  cycling; hip add/abd *Walking forward and back between exercises for recovery   Pt requires the buoyancy and hydrostatic pressure of water for support, and to offload joints by unweighting joint load by at least 50 % in navel deep water and by at least 75-80% in chest to neck deep water.  Viscosity of the water is needed for resistance of strengthening. Water current perturbations provides challenge to standing balance requiring increased core activation.    PATIENT EDUCATION:  Education details: aquatic therapy progressions/ modifications  Person educated: Patient Education method: Explanation Education comprehension: verbalized understanding  HOME EXERCISE PROGRAM: Pt has a land HEP.  Will give at a later date.  ASSESSMENT:  CLINICAL IMPRESSION: Good tolerance for gentle hip strengthening overall this session. She did have some hip discomfort (primarily in L hip) with prone hip extension and hip IR isometric. Pt felt strong stretch in bilateral glutes with piriformis stretch. Reviewed technique with HEP as pt  questioned herself with this. Pt asked if she can do sidelying clams at home. Advised she hold off on this as we monitor her response to more gentle glute med focused exercises first. Added quadruped alt UE reach to work on core activation. Pt states she has dificulty with bird dog exercise due to poor control and stability.   PN: Pt had a gap in services due to medical procedures she needed.  We have seen her in aquatics x only 2 sessions.  She has had small improvements in pain and slight improvement in strength (see chart above) as well as also good improvement on Foto score. Pt is instructed on proper use of pain scale. pain current 4/10; worst 24 hour 7/10, least 2/10 and it varies. The overall decrease in pain may be attributed in part to hiring a house cleaning and eliminating doing yard work. She did not tolerate IASTM/ ktape from last session reporting increase in pain. Of Note: she appears to be slightly hypermobile in elbow and knees. She will benefit from continued aquatic therapy intervention to improve strength in left hip, encourage more consistent exercise of HEP in setting which she prefers (pool), decrease pain and progress towards land based goals.  OBJECTIVE IMPAIRMENTS: decreased activity tolerance, decreased mobility, decreased strength, increased fascial restrictions, and pain.   ACTIVITY LIMITATIONS: lifting, bending, squatting, stairs, and bathing, ambulating up/down hills  PARTICIPATION LIMITATIONS: cleaning  PERSONAL FACTORS: Time since onset of injury/illness/exacerbation are also affecting patient's functional outcome.   REHAB POTENTIAL: Good  CLINICAL DECISION MAKING: Stable/uncomplicated  EVALUATION COMPLEXITY: Low   GOALS:   SHORT TERM GOALS: Target date: 05/29/2023 (Adjusted to 06/28/23)  Pt will tolerate aquatic therapy without adverse effects for improved mobility, pain,strength, and function.  Baseline: Goal status: In progress 06/19/23  2.   Pt will report at least a 50% improvement in pain and sx's overall.   Baseline:  Goal status: Improving about 25% goal achieved and revised 8/2  3.  Pt will report improved tolerance with performing household chores.  Baseline:  Goal status: In progress continues to progress 8/2     LONG TERM GOALS: Target date: 07/12/23  Pt will report she is able to bathe without increased pain.  Baseline:  Goal status: progressing 8/2  2.  Pt will be independent with land based and aquatic HEP for improved strength, pain, and function.   Baseline:  Goal status: progressing 8/2  3.  Pt will report she is able to perform her walking program including ambulating up/down hills without significant pain and limitation.  Baseline:  Goal status: INITIAL  4.  Pt will demo improved L hip strength to 5/5 MMT for improved tolerance with and performance of functional mobility skills.  Baseline:  Goal status: INITIAL    PLAN:  PT FREQUENCY: 2x/week  PT DURATION:6 weeks  PLANNED INTERVENTIONS: Therapeutic exercises, Therapeutic activity, Neuromuscular re-education, Balance training, Gait training, Patient/Family education, Self Care, Joint mobilization, Stair training, Aquatic Therapy, Dry Needling, Electrical stimulation, Spinal mobilization, Cryotherapy, Moist heat, Taping, Ultrasound, Manual therapy, and Re-evaluation  PLAN FOR NEXT SESSION:  Aquatics  Develop land program   Riki Altes, PTA  07/17/23 11:57 AM [facility] [address] [place], [place], [address] Phone: [phone]   Fax:  [phone]    Referring diagnosis? M67.959 (ICD-10-CM) - Tendinopathy of gluteal region  Treatment diagnosis? (if different than referring diagnosis)  Pain in left hip  Pain in right hip  Muscle weakness (generalized)  What was this (referring dx) caused by? []  Surgery []  Fall [x]  Ongoing issue []  Arthritis []  Other:  ____________  Laterality: [x]  Rt [x]  Lt []  Both  Check all possible CPT codes:  *CHOOSE 10 OR LESS*    [x]  97110 (Therapeutic Exercise)  []  92507 (SLP Treatment)  [x]  97112 (Neuro Re-ed)   []  92526 (Swallowing Treatment)   [x]  97116 (Gait Training)   []  [id number] (Cognitive Training, 1st 15 minutes) [x]  97140 (Manual Therapy)   []  97130 (Cognitive Training, each add'l 15 minutes)  [x]  97164 (Re-evaluation)                              []  Other, List CPT Code ____________  [x]  97530 (Therapeutic Activities)     [x]  97535 (Self Care)   []  All codes above (97110 - 97535)  []  97012 (Mechanical Traction)  [x]  97014 (E-stim Unattended)  [x]  97032 (E-stim manual)  []  97033 (Ionto)  [x]  97035 (Ultrasound) [x]  97750 (Physical Performance Training) [x]  [id number] (Aquatic Therapy) []  97016 (Vasopneumatic Device) []  [id number] (Paraffin) []  97034 (Contrast Bath) []  97597 (Wound Care 1st 20 sq cm) []  97598 (Wound Care each add'l 20 sq cm) []   40981 (Orthotic Fabrication, Fitting, Training Initial) []  [id number] (Prosthetic Management and Training Initial) []  [id number] (Orthotic or Prosthetic Training/ Modification Subsequent)

## 2023-07-17 NOTE — Addendum Note (Signed)
Addended by: Dessie Coma on: 07/17/2023 08:38 AM   Modules accepted: Orders

## 2023-07-24 ENCOUNTER — Ambulatory Visit (HOSPITAL_BASED_OUTPATIENT_CLINIC_OR_DEPARTMENT_OTHER): Payer: Medicare PPO

## 2023-07-24 ENCOUNTER — Encounter (HOSPITAL_BASED_OUTPATIENT_CLINIC_OR_DEPARTMENT_OTHER): Payer: Self-pay

## 2023-07-24 DIAGNOSIS — M25552 Pain in left hip: Secondary | ICD-10-CM | POA: Diagnosis not present

## 2023-07-24 DIAGNOSIS — M6281 Muscle weakness (generalized): Secondary | ICD-10-CM

## 2023-07-24 DIAGNOSIS — M25551 Pain in right hip: Secondary | ICD-10-CM | POA: Diagnosis not present

## 2023-07-24 NOTE — Therapy (Signed)
OUTPATIENT PHYSICAL THERAPY LOWER EXTREMITY TREATMENT    Patient Name: Catherine Fry MRN: [id number] DOB:[birth year], 57 y.o., female Today's Date: 07/24/2023  END OF SESSION:  PT End of Session - 07/24/23 1007     Visit Number 8    Number of Visits 12    Date for PT Re-Evaluation 08/28/23    Authorization Type [employer] MCR    PT Start Time 1021    PT Stop Time 1103    PT Time Calculation (min) 42 min    Activity Tolerance Patient tolerated treatment well    Behavior During Therapy WFL for tasks assessed/performed                Past Medical History:  Diagnosis Date   Atypical mole    moderte right upper buttock   Insomnia    Tachycardia    Tendinopathy of gluteal region    Past Surgical History:  Procedure Laterality Date   OOPHORECTOMY Right    TONSILLECTOMY     There are no problems to display for this patient.    REFERRING PROVIDER: Johnnette Litter MD  REFERRING DIAG: [phone] (ICD-10-CM) - Tendinopathy of gluteal region  THERAPY DIAG:  Pain in left hip  Pain in right hip  Muscle weakness (generalized)  Rationale for Evaluation and Treatment: Rehabilitation  ONSET DATE: chronic pain / early April 2024  SUBJECTIVE:   SUBJECTIVE STATEMENT: Pt reports 5/10 which comes and goes. Worse after picking up sticks in her mother's yard. Continued difficulty sleeping/turning over in bed.     From Eval:  Pt states she has been very active in her past, but has not been as active in the last 10-15 years.  She states she has put on some weight over the past 10-15 years.  Pt has a hx of lumbar injections due to having generalized pain in lumbar and bilat LE's.    Pt reports pain beginning 2.5 to 3 years ago.  Pt reports having pain worsening about 7 weeks ago, but not sure why.  Pt has seen several MD's and received chiropractic care, PT, and massage therapy.  Pt states she researched her sx's and thought she had gluteal tendinopathy.  Pt went to see Dr. Allena Katz  who agrees with pt.  Pt had x rays.  Pt has received cortisone shots in bilat hips.  Pt states meloxicam helps.  Pt states she is improving overall and able to sleep better.    PT order on 5/9 indicates bilat gluteal tendinopathy.  PT order also indicated to have pt participate in aquatic therapy to improve bilat LE proximal muscle strength, use other modalities, and provide pt with home exercise regimen.  Order also indicated R hip trochanteric bursitis.  Pt had PT approx 3-4 months ago.  Pt has a HEP though has not been performing her HEP.   Pt reports having pain in bilat glutes, lateral hips and thighs.  Pt reports having pain later after squatting to take care of her dog.  Pt has increased pain with bathing including turning to reach her side and legs.  Pt has pain with repetitive bending.  Pt avoids bending and lifting.  Pt is very limited with household chores and has someone to come and clean her home.  Pt has pain with stairs.  She walks 20 mins every AM.  Pt has increased pain with walking up/down hills.     PERTINENT HISTORY: Chronic pain Tachycardia IBS  PAIN:  Are you having pain? Yes NPRS:  2/10 current, 6/10 with touch to area Location:  bilat thighs and lateral hips, L > R  PRECAUTIONS: None  WEIGHT BEARING RESTRICTIONS: No  FALLS:  Has patient fallen in last 6 months? No  LIVING ENVIRONMENT: Lives with: lives with their spouse Lives in: 2 story home Stairs: yes  OCCUPATION: Pt is retired   PLOF: Independent.  Pt has had chronic pain which has affected her daily activities and daily mobility.   PATIENT GOALS: strengthen core and LE's, improve pain, and to be able to function in everyday activities.    OBJECTIVE:   DIAGNOSTIC FINDINGS: Pt reports having x rays.  Unable to view x rays in Epic.    PATIENT SURVEYS:  FOTO 28 with a goal of 52 at visit #14 06/19/23: 54%  COGNITION: Overall cognitive status: Within functional limits for tasks  assessed      PALPATION: TTP:  bilat glutes, GT  LOWER EXTREMITY ROM:  Active ROM Right eval Left eval  Hip flexion    Hip extension [facility]  Hip abduction [facility]  Hip adduction    Hip internal rotation    Hip external rotation    Knee flexion    Knee extension    Ankle dorsiflexion    Ankle plantarflexion    Ankle inversion    Ankle eversion     (Blank rows = not tested)  LOWER EXTREMITY MMT:  MMT Right eval Left eval Left 06/19/23 Right 8/2 Left 8/2  Hip flexion 5/5 4+/5 4+ 22.3 15.9  Hip extension 5/5 4/5 4+    Hip abduction 5/5 4/5 4+ 31 29.7  Hip adduction       Hip internal rotation 5/5 5/5 with pain     Hip external rotation 5/5 4+/5 4+/5    Knee flexion 5/5 5/5     Knee extension 5/5 5/5  30.6 29.2  Ankle dorsiflexion       Ankle plantarflexion       Ankle inversion       Ankle eversion        (Blank rows = not tested)  LOWER EXTREMITY SPECIAL TESTS:  Ober's test: negative bilat   GAIT: Assistive device utilized: None Level of assistance: Complete Independence Comments: Pt ambulates with a normalized gait pattern without limping.  Functional tests: 5x STS: 14.3 TUG:11.06  TODAY'S TREATMENT:                                                                                                                                 8/14  Manual stretch of piriformis/glute, HS, and ITB bilaterally STM to bil glute (focused on L side) and lower L paraspinals LTR x15  Bridge 2x10 Supine SLR x10 ea Sidelying clams x10 L Supine sciatic nerve glide (ankle pumps in LE flexion) x10  Child's pose 20s x3  Seated QL stretch 10s x2ea  Quadruped alternating UE raise with core activation x10ea  Quadruped LE extension x10ea  PATIENT EDUCATION:  Education details: aquatic therapy progressions/ modifications  Person educated: Patient Education method: Explanation Education comprehension: verbalized understanding  HOME EXERCISE PROGRAM: Pt has a land HEP.   Will give at a later date.  ASSESSMENT:  CLINICAL IMPRESSION: Some discomfort with passive IR of L hip and no limitations with ER. Feels strong stretch with piriformis stretch and LTR. Reviewed quadruped LE extension with pt for correct performance. Will montior soreness level following sessions and adjust as necessary. Pt is likely a good candidate for DN. Will continue to progress as tolerated.    PN: Pt had a gap in services due to medical procedures she needed.  We have seen her in aquatics x only 2 sessions.  She has had small improvements in pain and slight improvement in strength (see chart above) as well as also good improvement on Foto score. Pt is instructed on proper use of pain scale. pain current 4/10; worst 24 hour 7/10, least 2/10 and it varies. The overall decrease in pain may be attributed in part to hiring a house cleaning and eliminating doing yard work. She did not tolerate IASTM/ ktape from last session reporting increase in pain. Of Note: she appears to be slightly hypermobile in elbow and knees. She will benefit from continued aquatic therapy intervention to improve strength in left hip, encourage more consistent exercise of HEP in setting which she prefers (pool), decrease pain and progress towards land based goals.             OBJECTIVE IMPAIRMENTS: decreased activity tolerance, decreased mobility, decreased strength, increased fascial restrictions, and pain.   ACTIVITY LIMITATIONS: lifting, bending, squatting, stairs, and bathing, ambulating up/down hills  PARTICIPATION LIMITATIONS: cleaning  PERSONAL FACTORS: Time since onset of injury/illness/exacerbation are also affecting patient's functional outcome.   REHAB POTENTIAL: Good  CLINICAL DECISION MAKING: Stable/uncomplicated  EVALUATION COMPLEXITY: Low   GOALS:   SHORT TERM GOALS: Target date: 05/29/2023 (Adjusted to 06/28/23)  Pt will tolerate aquatic therapy without adverse effects for improved mobility,  pain,strength, and function.  Baseline: Goal status: In progress 06/19/23  2.  Pt will report at least a 50% improvement in pain and sx's overall.   Baseline:  Goal status: Improving about 25% goal achieved and revised 8/2  3.  Pt will report improved tolerance with performing household chores.  Baseline:  Goal status: In progress continues to progress 8/2     LONG TERM GOALS: Target date: 07/12/23  Pt will report she is able to bathe without increased pain.  Baseline:  Goal status: progressing 8/2  2.  Pt will be independent with land based and aquatic HEP for improved strength, pain, and function.   Baseline:  Goal status: progressing 8/2  3.  Pt will report she is able to perform her walking program including ambulating up/down hills without significant pain and limitation.  Baseline:  Goal status: INITIAL  4.  Pt will demo improved L hip strength to 5/5 MMT for improved tolerance with and performance of functional mobility skills.  Baseline:  Goal status: INITIAL    PLAN:  PT FREQUENCY: 2x/week  PT DURATION:6 weeks  PLANNED INTERVENTIONS: Therapeutic exercises, Therapeutic activity, Neuromuscular re-education, Balance training, Gait training, Patient/Family education, Self Care, Joint mobilization, Stair training, Aquatic Therapy, Dry Needling, Electrical stimulation, Spinal mobilization, Cryotherapy, Moist heat, Taping, Ultrasound, Manual therapy, and Re-evaluation  PLAN FOR NEXT SESSION:  Aquatics  Develop land program   Riki Altes, PTA  07/24/23 11:22 AM [facility] [phone]  [place], [place], [address] Phone: [phone]   Fax:  [phone]    Referring diagnosis? M67.959 (ICD-10-CM) - Tendinopathy of gluteal region  Treatment diagnosis? (if different than referring diagnosis)  Pain in left hip  Pain in right hip  Muscle weakness (generalized)  What was this (referring dx) caused by? []   Surgery []  Fall [x]  Ongoing issue []  Arthritis []  Other: ____________  Laterality: [x]  Rt [x]  Lt []  Both  Check all possible CPT codes:  *CHOOSE 10 OR LESS*    [x]  13244 (Therapeutic Exercise)  []  92507 (SLP Treatment)  [x]  97112 (Neuro Re-ed)   []  92526 (Swallowing Treatment)   [x]  97116 (Gait Training)   []  [id number] (Cognitive Training, 1st 15 minutes) [x]  97140 (Manual Therapy)   []  97130 (Cognitive Training, each add'l 15 minutes)  [x]  97164 (Re-evaluation)                              []  Other, List CPT Code ____________  [x]  97530 (Therapeutic Activities)     [x]  97535 (Self Care)   []  All codes above (97110 - 97535)  []  97012 (Mechanical Traction)  [x]  97014 (E-stim Unattended)  [x]  97032 (E-stim manual)  []  97033 (Ionto)  [x]  97035 (Ultrasound) [x]  97750 (Physical Performance Training) [x]  [id number] (Aquatic Therapy) []  97016 (Vasopneumatic Device) []  [id number] (Paraffin) []  97034 (Contrast Bath) []  97597 (Wound Care 1st 20 sq cm) []  97598 (Wound Care each add'l 20 sq cm) []  97760 (Orthotic Fabrication, Fitting, Training Initial) []  [id number] (Prosthetic Management and Training Initial) []  [id number] (Orthotic or Prosthetic Training/ Modification Subsequent)

## 2023-07-31 ENCOUNTER — Ambulatory Visit (HOSPITAL_BASED_OUTPATIENT_CLINIC_OR_DEPARTMENT_OTHER): Payer: Medicare PPO | Admitting: Physical Therapy

## 2023-07-31 ENCOUNTER — Encounter (HOSPITAL_BASED_OUTPATIENT_CLINIC_OR_DEPARTMENT_OTHER): Payer: Self-pay | Admitting: Physical Therapy

## 2023-07-31 DIAGNOSIS — M25552 Pain in left hip: Secondary | ICD-10-CM | POA: Diagnosis not present

## 2023-07-31 DIAGNOSIS — M6281 Muscle weakness (generalized): Secondary | ICD-10-CM

## 2023-07-31 DIAGNOSIS — M25551 Pain in right hip: Secondary | ICD-10-CM

## 2023-07-31 NOTE — Therapy (Signed)
OUTPATIENT PHYSICAL THERAPY LOWER EXTREMITY TREATMENT    Patient Name: Catherine Fry MRN: [id number] DOB:[birth year], 57 y.o., female Today's Date: 07/31/2023  END OF SESSION:  PT End of Session - 07/31/23 1344     Visit Number 9    Number of Visits 12    Date for PT Re-Evaluation 08/28/23    Authorization Type [employer] M[employer]    PT Start Time 1150    PT Stop Time 1233    PT Time Calculation (min) 43 min    Activity Tolerance Patient tolerated treatment well    Behavior During Therapy WFL for tasks assessed/performed                Past Medical History:  Diagnosis Date   Atypical mole    moderte right upper buttock   Insomnia    Tachycardia    Tendinopathy of gluteal region    Past Surgical History:  Procedure Laterality Date   OOPHORECTOMY Right    TONSILLECTOMY     There are no problems to display for this patient.    REFERRING PROVIDER: Johnnette Litter MD  REFERRING DIAG: [phone] (ICD-10-CM) - Tendinopathy of gluteal region  THERAPY DIAG:  Pain in left hip  Pain in right hip  Muscle weakness (generalized)  Rationale for Evaluation and Treatment: Rehabilitation  ONSET DATE: chronic pain / early April 2024  SUBJECTIVE:   SUBJECTIVE STATEMENT: The patient reports that she continues to have pain in her left hip that radiates into her leg. It is about the same but has not increased with increased activity. She has tolerated her treatment well. She just joined the gym.     From Eval:  Pt states she has been very active in her past, but has not been as active in the last 10-15 years.  She states she has put on some weight over the past 10-15 years.  Pt has a hx of lumbar injections due to having generalized pain in lumbar and bilat LE's.    Pt reports pain beginning 2.5 to 3 years ago.  Pt reports having pain worsening about 7 weeks ago, but not sure why.  Pt has seen several MD's and received chiropractic care, PT, and massage therapy.  Pt states she  researched her sx's and thought she had gluteal tendinopathy.  Pt went to see Dr. Allena Katz who agrees with pt.  Pt had x rays.  Pt has received cortisone shots in bilat hips.  Pt states meloxicam helps.  Pt states she is improving overall and able to sleep better.    PT order on 5/9 indicates bilat gluteal tendinopathy.  PT order also indicated to have pt participate in aquatic therapy to improve bilat LE proximal muscle strength, use other modalities, and provide pt with home exercise regimen.  Order also indicated R hip trochanteric bursitis.  Pt had PT approx 3-4 months ago.  Pt has a HEP though has not been performing her HEP.   Pt reports having pain in bilat glutes, lateral hips and thighs.  Pt reports having pain later after squatting to take care of her dog.  Pt has increased pain with bathing including turning to reach her side and legs.  Pt has pain with repetitive bending.  Pt avoids bending and lifting.  Pt is very limited with household chores and has someone to come and clean her home.  Pt has pain with stairs.  She walks 20 mins every AM.  Pt has increased pain with walking up/down hills.  PERTINENT HISTORY: Chronic pain Tachycardia IBS  PAIN:  Are you having pain? Yes NPRS:  2/10 current, 6/10 with touch to area Location:  bilat thighs and lateral hips, L > R  PRECAUTIONS: None  WEIGHT BEARING RESTRICTIONS: No  FALLS:  Has patient fallen in last 6 months? No  LIVING ENVIRONMENT: Lives with: lives with their spouse Lives in: 2 story home Stairs: yes  OCCUPATION: Pt is retired   PLOF: Independent.  Pt has had chronic pain which has affected her daily activities and daily mobility.   PATIENT GOALS: strengthen core and LE's, improve pain, and to be able to function in everyday activities.    OBJECTIVE:   DIAGNOSTIC FINDINGS: Pt reports having x rays.  Unable to view x rays in Epic.    PATIENT SURVEYS:  FOTO 28 with a goal of 52 at visit #14 06/19/23:  54%  COGNITION: Overall cognitive status: Within functional limits for tasks assessed      PALPATION: TTP:  bilat glutes, GT  LOWER EXTREMITY ROM:  Active ROM Right eval Left eval  Hip flexion    Hip extension [facility]  Hip abduction [facility]  Hip adduction    Hip internal rotation    Hip external rotation    Knee flexion    Knee extension    Ankle dorsiflexion    Ankle plantarflexion    Ankle inversion    Ankle eversion     (Blank rows = not tested)  LOWER EXTREMITY MMT:  MMT Right eval Left eval Left 06/19/23 Right 8/2 Left 8/2  Hip flexion 5/5 4+/5 4+ 22.3 15.9  Hip extension 5/5 4/5 4+    Hip abduction 5/5 4/5 4+ 31 29.7  Hip adduction       Hip internal rotation 5/5 5/5 with pain     Hip external rotation 5/5 4+/5 4+/5    Knee flexion 5/5 5/5     Knee extension 5/5 5/5  30.6 29.2  Ankle dorsiflexion       Ankle plantarflexion       Ankle inversion       Ankle eversion        (Blank rows = not tested)  LOWER EXTREMITY SPECIAL TESTS:  Ober's test: negative bilat   GAIT: Assistive device utilized: None Level of assistance: Complete Independence Comments: Pt ambulates with a normalized gait pattern without limping.  Functional tests: 5x STS: 14.3 TUG:11.06  TODAY'S TREATMENT:                                                                                                                                8/21  Manual: trigger point release to left gluteal; skilled palpation of trigger points.   Gluteal stretch 2x30 sec hold  Reviewed HEP and things to do to reduce  post needle soreness.   Trigger Point Dry-Needling  Treatment instructions: Expect mild to moderate muscle soreness. S/S of pneumothorax if dry needled over  a lung field, and to seek immediate medical attention should they occur. Patient verbalized understanding of these instructions and education.  Patient Consent Given: Yes Education handout provided: Yes Muscles treated: left gluteal  3x with a .30x75 needle Electrical stimulation performed: No Parameters: N/A Treatment response/outcome: good twitch   Leg press cybex 50 lbs 2x15  LF hip abduction 30 lbs 2x15   Reviewed use of cybex leg press  Cybex row machine with cuing for TA breathing 2x10     8/14  Manual stretch of piriformis/glute, HS, and ITB bilaterally STM to bil glute (focused on L side) and lower L paraspinals LTR x15  Bridge 2x10 Supine SLR x10 ea Sidelying clams x10 L Supine sciatic nerve glide (ankle pumps in LE flexion) x10  Child's pose 20s x3  Seated QL stretch 10s x2ea  Quadruped alternating UE raise with core activation x10ea  Quadruped LE extension x10ea  PATIENT EDUCATION:  Education details: aquatic therapy progressions/ modifications  Person educated: Patient Education method: Explanation Education comprehension: verbalized understanding  HOME EXERCISE PROGRAM: Pt has a land HEP.  Will give at a later date.  ASSESSMENT:  CLINICAL IMPRESSION: Therapy performed needling to the patients gluteal. She had a great twitch. We performed manual therapy to the area following. She had minor soreness. The patient has joined the gym. We brought her into the gym and reviewed use of the equipment. We reviewed proper grading using RPE. She tolerated well. She has one more visit with PT then an intake with her trainer. We will talk to her trainer.   PN: Pt had a gap in services due to medical procedures she needed.  We have seen her in aquatics x only 2 sessions.  She has had small improvements in pain and slight improvement in strength (see chart above) as well as also good improvement on Foto score. Pt is instructed on proper use of pain scale. pain current 4/10; worst 24 hour 7/10, least 2/10 and it varies. The overall decrease in pain may be attributed in part to hiring a house cleaning and eliminating doing yard work. She did not tolerate IASTM/ ktape from last session reporting increase in  pain. Of Note: she appears to be slightly hypermobile in elbow and knees. She will benefit from continued aquatic therapy intervention to improve strength in left hip, encourage more consistent exercise of HEP in setting which she prefers (pool), decrease pain and progress towards land based goals.             OBJECTIVE IMPAIRMENTS: decreased activity tolerance, decreased mobility, decreased strength, increased fascial restrictions, and pain.   ACTIVITY LIMITATIONS: lifting, bending, squatting, stairs, and bathing, ambulating up/down hills  PARTICIPATION LIMITATIONS: cleaning  PERSONAL FACTORS: Time since onset of injury/illness/exacerbation are also affecting patient's functional outcome.   REHAB POTENTIAL: Good  CLINICAL DECISION MAKING: Stable/uncomplicated  EVALUATION COMPLEXITY: Low   GOALS:   SHORT TERM GOALS: Target date: 05/29/2023 (Adjusted to 06/28/23)  Pt will tolerate aquatic therapy without adverse effects for improved mobility, pain,strength, and function.  Baseline: Goal status: In progress 06/19/23  2.  Pt will report at least a 50% improvement in pain and sx's overall.   Baseline:  Goal status: Improving about 25% goal achieved and revised 8/2  3.  Pt will report improved tolerance with performing household chores.  Baseline:  Goal status: In progress continues to progress 8/2     LONG TERM GOALS: Target date: 07/12/23  Pt will report she is able to bathe without  increased pain.  Baseline:  Goal status: progressing 8/2  2.  Pt will be independent with land based and aquatic HEP for improved strength, pain, and function.   Baseline:  Goal status: progressing 8/2  3.  Pt will report she is able to perform her walking program including ambulating up/down hills without significant pain and limitation.  Baseline:  Goal status: INITIAL  4.  Pt will demo improved L hip strength to 5/5 MMT for improved tolerance with and performance of functional mobility  skills.  Baseline:  Goal status: INITIAL    PLAN:  PT FREQUENCY: 2x/week  PT DURATION:6 weeks  PLANNED INTERVENTIONS: Therapeutic exercises, Therapeutic activity, Neuromuscular re-education, Balance training, Gait training, Patient/Family education, Self Care, Joint mobilization, Stair training, Aquatic Therapy, Dry Needling, Electrical stimulation, Spinal mobilization, Cryotherapy, Moist heat, Taping, Ultrasound, Manual therapy, and Re-evaluation  PLAN FOR NEXT SESSION:  Aquatics  Develop land program   Riki Altes, PTA  07/31/23 2:27 PM [facility] [address] [place], [place], [address] Phone: [phone]   Fax:  [phone]    Referring diagnosis? M67.959 (ICD-10-CM) - Tendinopathy of gluteal region  Treatment diagnosis? (if different than referring diagnosis)  Pain in left hip  Pain in right hip  Muscle weakness (generalized)  What was this (referring dx) caused by? []  Surgery []  Fall [x]  Ongoing issue []  Arthritis []  Other: ____________  Laterality: [x]  Rt [x]  Lt []  Both  Check all possible CPT codes:  *CHOOSE 10 OR LESS*    [x]  97110 (Therapeutic Exercise)  []  92507 (SLP Treatment)  [x]  97112 (Neuro Re-ed)   []  92526 (Swallowing Treatment)   [x]  65784 (Gait Training)   []  [phone] (Cognitive Training, 1st 15 minutes) [x]  97140 (Manual Therapy)   []  97130 (Cognitive Training, each add'l 15 minutes)  [x]  97164 (Re-evaluation)                              []  Other, List CPT Code ____________  [x]  97530 (Therapeutic Activities)     [x]  52841 (Self Care)   []  All codes above (97110 - 97535)  []  97012 (Mechanical Traction)  [x]  97014 (E-stim Unattended)  [x]  97032 (E-stim manual)  []  97033 (Ionto)  [x]  97035 (Ultrasound) [x]  97750 (Physical Performance Training) [x]  [id number] (Aquatic Therapy) []  97016 (Vasopneumatic Device) []  [id number] (Paraffin) []  97034 (Contrast Bath) []  97597 (Wound Care 1st 20 sq  cm) []  97598 (Wound Care each add'l 20 sq cm) []  97760 (Orthotic Fabrication, Fitting, Training Initial) []  [id number] (Prosthetic Management and Training Initial) []  [id number] (Orthotic or Prosthetic Training/ Modification Subsequent)

## 2023-08-07 ENCOUNTER — Ambulatory Visit (HOSPITAL_BASED_OUTPATIENT_CLINIC_OR_DEPARTMENT_OTHER): Payer: Medicare PPO | Admitting: Physical Therapy

## 2023-08-07 ENCOUNTER — Encounter (HOSPITAL_BASED_OUTPATIENT_CLINIC_OR_DEPARTMENT_OTHER): Payer: Self-pay | Admitting: Physical Therapy

## 2023-08-07 DIAGNOSIS — M6281 Muscle weakness (generalized): Secondary | ICD-10-CM | POA: Diagnosis not present

## 2023-08-07 DIAGNOSIS — M25552 Pain in left hip: Secondary | ICD-10-CM

## 2023-08-07 DIAGNOSIS — M25551 Pain in right hip: Secondary | ICD-10-CM

## 2023-08-07 NOTE — Therapy (Addendum)
 OUTPATIENT PHYSICAL THERAPY LOWER EXTREMITY TREATMENT    Patient Name: Catherine Fry MRN: [id number] DOB:02/05/1966, 57 y.o., female Today's Date: 08/08/2023  END OF SESSION:  PT End of Session - 08/07/23 1518     Visit Number 10    Number of Visits 12    Date for PT Re-Evaluation 08/28/23    Authorization Type [employer] M[employer]    PT Start Time 1320    PT Stop Time 1358    PT Time Calculation (min) 38 min    Activity Tolerance Patient tolerated treatment well    Behavior During Therapy [facility] for tasks assessed/performed             Progress Note Reporting Period 05/08/2023 to 08/07/2023  See note below for Objective Data and Assessment of Progress/Goals.        Past Medical History:  Diagnosis Date   Atypical mole    moderte right upper buttock   Insomnia    Tachycardia    Tendinopathy of gluteal region    Past Surgical History:  Procedure Laterality Date   OOPHORECTOMY Right    TONSILLECTOMY     There are no problems to display for this patient.    REFERRING PROVIDER: Johnnette Litter MD  REFERRING DIAG: [phone] (ICD-10-CM) - Tendinopathy of gluteal region  THERAPY DIAG:  Pain in left hip  Pain in right hip  Muscle weakness (generalized)  Rationale for Evaluation and Treatment: Rehabilitation  ONSET DATE: chronic pain / early April 2024  SUBJECTIVE:   SUBJECTIVE STATEMENT: The patient reports that she continues to have pain in her left hip that radiates into her leg. It is about the same but has not increased with increased activity. She has tolerated her treatment well. She just joined the gym.     From Eval:  Pt states she has been very active in her past, but has not been as active in the last 10-15 years.  She states she has put on some weight over the past 10-15 years.  Pt has a hx of lumbar injections due to having generalized pain in lumbar and bilat LE's.    Pt reports pain beginning 2.5 to 3 years ago.  Pt reports having pain worsening  about 7 weeks ago, but not sure why.  Pt has seen several MD's and received chiropractic care, PT, and massage therapy.  Pt states she researched her sx's and thought she had gluteal tendinopathy.  Pt went to see Dr. Allena Katz who agrees with pt.  Pt had x rays.  Pt has received cortisone shots in bilat hips.  Pt states meloxicam helps.  Pt states she is improving overall and able to sleep better.    PT order on 5/9 indicates bilat gluteal tendinopathy.  PT order also indicated to have pt participate in aquatic therapy to improve bilat LE proximal muscle strength, use other modalities, and provide pt with home exercise regimen.  Order also indicated R hip trochanteric bursitis.  Pt had PT approx 3-4 months ago.  Pt has a HEP though has not been performing her HEP.   Pt reports having pain in bilat glutes, lateral hips and thighs.  Pt reports having pain later after squatting to take care of her dog.  Pt has increased pain with bathing including turning to reach her side and legs.  Pt has pain with repetitive bending.  Pt avoids bending and lifting.  Pt is very limited with household chores and has someone to come and clean her home.  Pt has pain with stairs.  She walks 20 mins every AM.  Pt has increased pain with walking up/down hills.     PERTINENT HISTORY: Chronic pain Tachycardia IBS  PAIN:  Are you having pain? Yes NPRS:  2/10 current, 6/10 with touch to area Location:  bilat thighs and lateral hips, L > R  PRECAUTIONS: None  WEIGHT BEARING RESTRICTIONS: No  FALLS:  Has patient fallen in last 6 months? No  LIVING ENVIRONMENT: Lives with: lives with their spouse Lives in: 2 story home Stairs: yes  OCCUPATION: Pt is retired   PLOF: Independent.  Pt has had chronic pain which has affected her daily activities and daily mobility.   PATIENT GOALS: strengthen core and LE's, improve pain, and to be able to function in everyday activities.    OBJECTIVE:   DIAGNOSTIC FINDINGS: Pt  reports having x rays.  Unable to view x rays in Epic.    PATIENT SURVEYS:  FOTO 28 with a goal of 52 at visit #14 06/19/23: 54%  COGNITION: Overall cognitive status: Within functional limits for tasks assessed      PALPATION: TTP:  bilat glutes, GT  LOWER EXTREMITY ROM:  Active ROM Right eval Left eval  Hip flexion    Hip extension [facility]  Hip abduction [facility]  Hip adduction    Hip internal rotation    Hip external rotation    Knee flexion    Knee extension    Ankle dorsiflexion    Ankle plantarflexion    Ankle inversion    Ankle eversion     (Blank rows = not tested)  LOWER EXTREMITY MMT:  MMT Right eval Left eval Left 06/19/23 Right 8/2 Left 8/2  Hip flexion 5/5 4+/5 4+ 22.3 15.9  Hip extension 5/5 4/5 4+    Hip abduction 5/5 4/5 4+ 31 29.7  Hip adduction       Hip internal rotation 5/5 5/5 with pain     Hip external rotation 5/5 4+/5 4+/5    Knee flexion 5/5 5/5     Knee extension 5/5 5/5  30.6 29.2  Ankle dorsiflexion       Ankle plantarflexion       Ankle inversion       Ankle eversion        (Blank rows = not tested)  LOWER EXTREMITY SPECIAL TESTS:  Ober's test: negative bilat   GAIT: Assistive device utilized: None Level of assistance: Complete Independence Comments: Pt ambulates with a normalized gait pattern without limping.  Functional tests: 5x STS: 14.3 TUG:11.06  TODAY'S TREATMENT:                                                                                                                               8/28 Reviewed full gym program   Reviewed octane fit and exercise bike  Reviewed how to use her exercise program program at home  Reviewed cardio row machine upstairs  Reviewed use of free weights upstairs   Reivewed HEP and how to use it at home.       8/21  Manual: trigger point release to left gluteal; skilled palpation of trigger points.   Gluteal stretch 2x30 sec hold  Reviewed HEP and things to do to reduce   post needle soreness.   Trigger Point Dry-Needling  Treatment instructions: Expect mild to moderate muscle soreness. S/S of pneumothorax if dry needled over a lung field, and to seek immediate medical attention should they occur. Patient verbalized understanding of these instructions and education.  Patient Consent Given: Yes Education handout provided: Yes Muscles treated: left gluteal 3x with a .30x75 needle Electrical stimulation performed: No Parameters: N/A Treatment response/outcome: good twitch   Leg press cybex 50 lbs 2x15  LF hip abduction 30 lbs 2x15   Reviewed use of cybex leg press  Cybex row machine with cuing for TA breathing 2x10     8/14  Manual stretch of piriformis/glute, HS, and ITB bilaterally STM to bil glute (focused on L side) and lower L paraspinals LTR x15  Bridge 2x10 Supine SLR x10 ea Sidelying clams x10 L Supine sciatic nerve glide (ankle pumps in LE flexion) x10  Child's pose 20s x3  Seated QL stretch 10s x2ea  Quadruped alternating UE raise with core activation x10ea  Quadruped LE extension x10ea  PATIENT EDUCATION:  Education details: aquatic therapy progressions/ modifications  Person educated: Patient Education method: Explanation Education comprehension: verbalized understanding  HOME EXERCISE PROGRAM: Pt has a land HEP.  Will give at a later date.  ASSESSMENT:  CLINICAL IMPRESSION: Therapy performed needling to the patients gluteal. She had a great twitch. We performed manual therapy to the area following. She had minor soreness. The patient has joined the gym. We brought her into the gym and reviewed use of the equipment. We reviewed proper grading using RPE. She tolerated well. She has one more visit with PT then an intake with her trainer. We will talk to her trainer.   PN: Pt had a gap in services due to medical procedures she needed.  We have seen her in aquatics x only 2 sessions.  She has had small improvements in pain and  slight improvement in strength (see chart above) as well as also good improvement on Foto score. Pt is instructed on proper use of pain scale. pain current 4/10; worst 24 hour 7/10, least 2/10 and it varies. The overall decrease in pain may be attributed in part to hiring a house cleaning and eliminating doing yard work. She did not tolerate IASTM/ ktape from last session reporting increase in pain. Of Note: she appears to be slightly hypermobile in elbow and knees. She will benefit from continued aquatic therapy intervention to improve strength in left hip, encourage more consistent exercise of HEP in setting which she prefers (pool), decrease pain and progress towards land based goals.             OBJECTIVE IMPAIRMENTS: decreased activity tolerance, decreased mobility, decreased strength, increased fascial restrictions, and pain.   ACTIVITY LIMITATIONS: lifting, bending, squatting, stairs, and bathing, ambulating up/down hills  PARTICIPATION LIMITATIONS: cleaning  PERSONAL FACTORS: Time since onset of injury/illness/exacerbation are also affecting patient's functional outcome.   REHAB POTENTIAL: Good  CLINICAL DECISION MAKING: Stable/uncomplicated  EVALUATION COMPLEXITY: Low   GOALS:   SHORT TERM GOALS: Target date: 05/29/2023 (Adjusted to 06/28/23)  Pt will tolerate aquatic therapy without adverse effects for improved mobility, pain,strength, and function.  Baseline:  Goal status: In progress 06/19/23  2.  Pt will report at least a 50% improvement in pain and sx's overall.   Baseline:  Goal status: Improving about 25% goal achieved and revised 8/2  3.  Pt will report improved tolerance with performing household chores.  Baseline:  Goal status: In progress continues to progress 8/2     LONG TERM GOALS: Target date: 07/12/23  Pt will report she is able to bathe without increased pain.  Baseline:  Goal status: progressing 8/2  2.  Pt will be independent with land based and  aquatic HEP for improved strength, pain, and function.   Baseline:  Goal status: progressing 8/2  3.  Pt will report she is able to perform her walking program including ambulating up/down hills without significant pain and limitation.  Baseline:  Goal status: INITIAL  4.  Pt will demo improved L hip strength to 5/5 MMT for improved tolerance with and performance of functional mobility skills.  Baseline:  Goal status: INITIAL    PLAN:  PT FREQUENCY: 2x/week  PT DURATION:6 weeks  PLANNED INTERVENTIONS: Therapeutic exercises, Therapeutic activity, Neuromuscular re-education, Balance training, Gait training, Patient/Family education, Self Care, Joint mobilization, Stair training, Aquatic Therapy, Dry Needling, Electrical stimulation, Spinal mobilization, Cryotherapy, Moist heat, Taping, Ultrasound, Manual therapy, and Re-evaluation  PLAN FOR NEXT SESSION:  Aquatics  Develop land program   Riki Altes, PTA  08/08/23 12:40 PM [facility] [address] [place], [place], [address] Phone: [phone]   Fax:  [phone]    Referring diagnosis? M67.959 (ICD-10-CM) - Tendinopathy of gluteal region  Treatment diagnosis? (if different than referring diagnosis)  Pain in left hip  Pain in right hip  Muscle weakness (generalized)  What was this (referring dx) caused by? []  Surgery []  Fall [x]  Ongoing issue []  Arthritis []  Other: ____________  Laterality: [x]  Rt [x]  Lt []  Both  Check all possible CPT codes:  *CHOOSE 10 OR LESS*    [x]  97110 (Therapeutic Exercise)  []  92507 (SLP Treatment)  [x]  97112 (Neuro Re-ed)   []  92526 (Swallowing Treatment)   [x]  44034 (Gait Training)   []  [id number] (Cognitive Training, 1st 15 minutes) [x]  97140 (Manual Therapy)   []  97130 (Cognitive Training, each add'l 15 minutes)  [x]  97164 (Re-evaluation)                              []  Other, List CPT Code ____________  [x]  97530 (Therapeutic  Activities)     [x]  97535 (Self Care)   []  All codes above (97110 - 97535)  []  97012 (Mechanical Traction)  [x]  97014 (E-stim Unattended)  [x]  97032 (E-stim manual)  []  97033 (Ionto)  [x]  97035 (Ultrasound) [x]  97750 (Physical Performance Training) [x]  [id number] (Aquatic Therapy) []  97016 (Vasopneumatic Device) []  [id number] (Paraffin) []  97034 (Contrast Bath) []  97597 (Wound Care 1st 20 sq cm) []  97598 (Wound Care each add'l 20 sq cm) []  97760 (Orthotic Fabrication, Fitting, Training Initial) []  [id number] (Prosthetic Management and Training Initial) []  [id number] (Orthotic or Prosthetic Training/ Modification Subsequent)  PHYSICAL THERAPY DISCHARGE SUMMARY  Visits from Start of Care: 10  Current functional level related to goals / functional outcomes: Unable to assess pt's current functional status due to pt not present at discharge.  See above for goals   Remaining deficits: Unable to assess pt's due to pt not present at discharge.  See above for status on her last visit.  Education / Equipment: See above   Patient was seen in PT from 05/08/23 - 08/07/23.  See above for assessment on last visit and plan of care.  Pt had joined the gym.  She will be considered discharged from skilled PT at this time.      Audie Clear III PT, DPT 02/03/24 8:23 AM

## 2023-08-08 ENCOUNTER — Encounter (HOSPITAL_BASED_OUTPATIENT_CLINIC_OR_DEPARTMENT_OTHER): Payer: Self-pay | Admitting: Physical Therapy

## 2023-09-24 DIAGNOSIS — Z1330 Encounter for screening examination for mental health and behavioral disorders, unspecified: Secondary | ICD-10-CM | POA: Diagnosis not present

## 2023-09-24 DIAGNOSIS — R000 Tachycardia, unspecified: Secondary | ICD-10-CM | POA: Insufficient documentation

## 2023-10-11 DIAGNOSIS — M7062 Trochanteric bursitis, left hip: Secondary | ICD-10-CM | POA: Diagnosis not present

## 2023-10-17 DIAGNOSIS — R000 Tachycardia, unspecified: Secondary | ICD-10-CM | POA: Diagnosis not present

## 2023-10-29 DIAGNOSIS — R000 Tachycardia, unspecified: Secondary | ICD-10-CM | POA: Diagnosis not present

## 2023-11-05 DIAGNOSIS — R002 Palpitations: Secondary | ICD-10-CM | POA: Diagnosis not present

## 2023-11-05 DIAGNOSIS — I10 Essential (primary) hypertension: Secondary | ICD-10-CM | POA: Diagnosis not present

## 2023-11-05 DIAGNOSIS — R000 Tachycardia, unspecified: Secondary | ICD-10-CM | POA: Diagnosis not present

## 2023-11-14 DIAGNOSIS — N9089 Other specified noninflammatory disorders of vulva and perineum: Secondary | ICD-10-CM | POA: Diagnosis not present

## 2023-11-14 DIAGNOSIS — R102 Pelvic and perineal pain: Secondary | ICD-10-CM | POA: Diagnosis not present

## 2023-11-14 DIAGNOSIS — Z1339 Encounter for screening examination for other mental health and behavioral disorders: Secondary | ICD-10-CM | POA: Diagnosis not present

## 2023-11-14 DIAGNOSIS — N952 Postmenopausal atrophic vaginitis: Secondary | ICD-10-CM | POA: Diagnosis not present

## 2023-11-19 DIAGNOSIS — R102 Pelvic and perineal pain: Secondary | ICD-10-CM | POA: Diagnosis not present

## 2023-11-19 DIAGNOSIS — N6489 Other specified disorders of breast: Secondary | ICD-10-CM | POA: Diagnosis not present

## 2023-11-27 DIAGNOSIS — R1909 Other intra-abdominal and pelvic swelling, mass and lump: Secondary | ICD-10-CM | POA: Diagnosis not present

## 2023-12-11 HISTORY — PX: BREAST CYST EXCISION: SHX579

## 2024-01-17 DIAGNOSIS — M7061 Trochanteric bursitis, right hip: Secondary | ICD-10-CM | POA: Diagnosis not present

## 2024-01-17 DIAGNOSIS — M7062 Trochanteric bursitis, left hip: Secondary | ICD-10-CM | POA: Diagnosis not present

## 2024-01-20 DIAGNOSIS — E210 Primary hyperparathyroidism: Secondary | ICD-10-CM | POA: Diagnosis not present

## 2024-01-20 DIAGNOSIS — Z13820 Encounter for screening for osteoporosis: Secondary | ICD-10-CM | POA: Diagnosis not present

## 2024-01-20 DIAGNOSIS — K219 Gastro-esophageal reflux disease without esophagitis: Secondary | ICD-10-CM | POA: Diagnosis not present

## 2024-01-20 DIAGNOSIS — Z1330 Encounter for screening examination for mental health and behavioral disorders, unspecified: Secondary | ICD-10-CM | POA: Diagnosis not present

## 2024-01-22 DIAGNOSIS — E210 Primary hyperparathyroidism: Secondary | ICD-10-CM | POA: Diagnosis not present

## 2024-01-23 DIAGNOSIS — Z6828 Body mass index (BMI) 28.0-28.9, adult: Secondary | ICD-10-CM | POA: Diagnosis not present

## 2024-01-23 DIAGNOSIS — R030 Elevated blood-pressure reading, without diagnosis of hypertension: Secondary | ICD-10-CM | POA: Diagnosis not present

## 2024-01-23 DIAGNOSIS — N1831 Chronic kidney disease, stage 3a: Secondary | ICD-10-CM | POA: Diagnosis not present

## 2024-01-28 DIAGNOSIS — Z13820 Encounter for screening for osteoporosis: Secondary | ICD-10-CM | POA: Diagnosis not present

## 2024-01-28 DIAGNOSIS — E210 Primary hyperparathyroidism: Secondary | ICD-10-CM | POA: Diagnosis not present

## 2024-02-04 DIAGNOSIS — N83202 Unspecified ovarian cyst, left side: Secondary | ICD-10-CM | POA: Diagnosis not present

## 2024-02-04 DIAGNOSIS — Z1231 Encounter for screening mammogram for malignant neoplasm of breast: Secondary | ICD-10-CM | POA: Diagnosis not present

## 2024-02-07 DIAGNOSIS — R921 Mammographic calcification found on diagnostic imaging of breast: Secondary | ICD-10-CM | POA: Diagnosis not present

## 2024-02-11 ENCOUNTER — Ambulatory Visit
Admission: EM | Admit: 2024-02-11 | Discharge: 2024-02-11 | Disposition: A | Discharge status: Home / Self Care | Attending: Family Medicine | Admitting: Family Medicine

## 2024-02-11 DIAGNOSIS — Z1211 Encounter for screening for malignant neoplasm of colon: Secondary | ICD-10-CM | POA: Diagnosis not present

## 2024-02-11 DIAGNOSIS — R059 Cough, unspecified: Secondary | ICD-10-CM

## 2024-02-11 DIAGNOSIS — N952 Postmenopausal atrophic vaginitis: Secondary | ICD-10-CM | POA: Diagnosis not present

## 2024-02-11 DIAGNOSIS — Z1339 Encounter for screening examination for other mental health and behavioral disorders: Secondary | ICD-10-CM | POA: Diagnosis not present

## 2024-02-11 DIAGNOSIS — J069 Acute upper respiratory infection, unspecified: Secondary | ICD-10-CM | POA: Diagnosis not present

## 2024-02-11 DIAGNOSIS — Z1239 Encounter for other screening for malignant neoplasm of breast: Secondary | ICD-10-CM | POA: Diagnosis not present

## 2024-02-11 DIAGNOSIS — Z01419 Encounter for gynecological examination (general) (routine) without abnormal findings: Secondary | ICD-10-CM | POA: Diagnosis not present

## 2024-02-11 DIAGNOSIS — N6489 Other specified disorders of breast: Secondary | ICD-10-CM | POA: Diagnosis not present

## 2024-02-11 DIAGNOSIS — N83202 Unspecified ovarian cyst, left side: Secondary | ICD-10-CM | POA: Diagnosis not present

## 2024-02-11 DIAGNOSIS — Z124 Encounter for screening for malignant neoplasm of cervix: Secondary | ICD-10-CM | POA: Diagnosis not present

## 2024-02-11 MED ORDER — AZITHROMYCIN 250 MG PO TABS: 250.0000 mg | ORAL_TABLET | Freq: Every day | ORAL | 0 refills | Status: DC

## 2024-02-11 MED ORDER — PREDNISONE 20 MG PO TABS: ORAL_TABLET | ORAL | 0 refills | Status: DC

## 2024-02-11 NOTE — ED Triage Notes (Signed)
 Pt presents to uc with co of cough and congestion for 5-6 weeks that they report has been worsening over the last week.

## 2024-02-11 NOTE — ED Provider Notes (Signed)
 [facility]    CSN: [id number] Arrival date & time: 02/11/24  1244      History   Chief Complaint Chief Complaint  Patient presents with   Cough   Nasal Congestion    HPI Catherine Fry is a 58 y.o. female.   HPI 58 year old female presents with cough and congestion for 5 to 6 weeks that has been worsening over the past week.  PMH significant for insomnia and tachycardia.  Past Medical History:  Diagnosis Date   Atypical mole    moderte right upper buttock   Insomnia    Tachycardia    Tendinopathy of gluteal region     There are no active problems to display for this patient.   Past Surgical History:  Procedure Laterality Date   OOPHORECTOMY Right    TONSILLECTOMY      OB History   No obstetric history on file.      Home Medications    Prior to Admission medications   Medication Sig Start Date End Date Taking? Authorizing Provider  azithromycin (ZITHROMAX) 250 MG tablet Take 1 tablet (250 mg total) by mouth daily. Take first 2 tablets together, then 1 every day until finished. 02/11/24  Yes Trevor Iha, FNP  predniSONE (DELTASONE) 20 MG tablet Take 3 tabs PO daily x 5 days. 02/11/24  Yes Trevor Iha, FNP  clonazePAM (KLONOPIN) 1 MG tablet Take 2 mg by mouth at bedtime.    [provider]  gabapentin (NEURONTIN) 300 MG capsule Take 1,200 mg by mouth at bedtime.    [provider]  lamoTRIgine (LAMICTAL) 200 MG tablet Take 200 mg by mouth daily.    [provider]  metoprolol succinate (TOPROL-XL) 25 MG 24 hr tablet Take 1 tablet by mouth daily. 04/25/21   [provider]  TURMERIC PO Take by mouth.    [provider]    Family History Family History  Problem Relation Age of Onset   Cancer Mother        breast   Lymphoma Father    Alzheimer's disease Father     Social History Social History   Tobacco Use   Smoking status: Never   Smokeless tobacco: Never  Vaping Use   Vaping status:  Never Used  Substance Use Topics   Alcohol use: Yes    Comment: occasionally   Drug use: No     Allergies   Minocycline   Review of Systems Review of Systems  HENT:  Positive for congestion.   Respiratory:  Positive for cough.      Physical Exam Triage Vital Signs ED Triage Vitals  Encounter Vitals Group     BP 02/11/24 1309 (!) 146/83     Systolic BP Percentile --      Diastolic BP Percentile --      Pulse Rate 02/11/24 1309 66     Resp 02/11/24 1309 16     Temp 02/11/24 1309 (!) 97.5 F (36.4 C)     Temp src --      SpO2 02/11/24 1309 98 %     Weight --      Height --      Head Circumference --      Peak Flow --      Pain Score 02/11/24 1308 0     Pain Loc --      Pain Education --      Exclude from Growth Chart --    No data found.  Updated  Vital Signs BP (!) 146/83   Pulse 66   Temp (!) 97.5 F (36.4 C)   Resp 16   SpO2 98%    Physical Exam Vitals and nursing note reviewed.  Constitutional:      Appearance: Normal appearance. She is normal weight.  HENT:     Head: Normocephalic and atraumatic.     Mouth/Throat:     Mouth: Mucous membranes are moist.     Pharynx: Oropharynx is clear.  Eyes:     Extraocular Movements: Extraocular movements intact.     Conjunctiva/sclera: Conjunctivae normal.     Pupils: Pupils are equal, round, and reactive to light.  Cardiovascular:     Rate and Rhythm: Normal rate and regular rhythm.     Pulses: Normal pulses.     Heart sounds: Normal heart sounds.  Pulmonary:     Effort: Pulmonary effort is normal.     Breath sounds: Normal breath sounds. No wheezing, rhonchi or rales.     Comments: Infrequent nonproductive cough on exam Musculoskeletal:        General: Normal range of motion.     Cervical back: Normal range of motion and neck supple.  Skin:    General: Skin is warm and dry.  Neurological:     General: No focal deficit present.     Mental Status: She is alert and oriented to person, place, and time.  Mental status is at baseline.  Psychiatric:        Mood and Affect: Mood normal.        Behavior: Behavior normal.      UC Treatments / Results  Labs (all labs ordered are listed, but only abnormal results are displayed) Labs Reviewed - No data to display  EKG   Radiology No results found.  Procedures Procedures (including critical care time)  Medications Ordered in UC Medications - No data to display  Initial Impression / Assessment and Plan / UC Course  I have reviewed the triage vital signs and the nursing notes.  Pertinent labs & imaging results that were available during my care of the patient were reviewed by me and considered in my medical decision making (see chart for details).     MDM: 1.  Acute URI-Rx'd Zithromax (500 mg day 1, then 250 mg day 2-5); 2.  Cough, unspecified type-Rx prednisone 20 mg tablet: Take 3 tablets p.o. daily x 5 days. Advised patient to take medication as directed with food to completion.  Advised patient to take prednisone with Zithromax to completion.  Encouraged to increase daily water intake to 64 ounces per day while taking this medication.  Advised if symptoms worsen and/or unresolved please follow-up with your PCP or here for further evaluation.  Discharged home, hemodynamically stable. Final Clinical Impressions(s) / UC Diagnoses   Final diagnoses:  Acute URI  Cough, unspecified type     Discharge Instructions      Advised patient to take medication as directed with food to completion.  Advised patient to take prednisone with Zithromax to completion.  Encouraged to increase daily water intake to 64 ounces per day while taking this medication.  Advised if symptoms worsen and/or unresolved please follow-up with your PCP or here for further evaluation.     ED Prescriptions     Medication Sig Dispense Auth. Provider   azithromycin (ZITHROMAX) 250 MG tablet Take 1 tablet (250 mg total) by mouth daily. Take first 2 tablets  together, then 1 every day until finished. 6 tablet  Trevor Iha, FNP   predniSONE (DELTASONE) 20 MG tablet Take 3 tabs PO daily x 5 days. 15 tablet Trevor Iha, FNP      PDMP not reviewed this encounter.   Trevor Iha, FNP 02/11/24 1352

## 2024-02-11 NOTE — Discharge Instructions (Addendum)
 Advised patient to take medication as directed with food to completion.  Advised patient to take prednisone with Zithromax to completion.  Encouraged to increase daily water intake to 64 ounces per day while taking this medication.  Advised if symptoms worsen and/or unresolved please follow-up with your PCP or here for further evaluation.

## 2024-02-13 ENCOUNTER — Other Ambulatory Visit: Payer: Self-pay

## 2024-02-13 DIAGNOSIS — R921 Mammographic calcification found on diagnostic imaging of breast: Secondary | ICD-10-CM | POA: Diagnosis not present

## 2024-02-13 DIAGNOSIS — N6489 Other specified disorders of breast: Secondary | ICD-10-CM | POA: Diagnosis not present

## 2024-02-13 DIAGNOSIS — N6312 Unspecified lump in the right breast, upper inner quadrant: Secondary | ICD-10-CM | POA: Diagnosis not present

## 2024-02-14 LAB — SURGICAL PATHOLOGY

## 2024-02-25 DIAGNOSIS — N1831 Chronic kidney disease, stage 3a: Secondary | ICD-10-CM | POA: Diagnosis not present

## 2024-02-25 DIAGNOSIS — R030 Elevated blood-pressure reading, without diagnosis of hypertension: Secondary | ICD-10-CM | POA: Diagnosis not present

## 2024-02-25 DIAGNOSIS — Z6827 Body mass index (BMI) 27.0-27.9, adult: Secondary | ICD-10-CM | POA: Diagnosis not present

## 2024-03-06 DIAGNOSIS — N1831 Chronic kidney disease, stage 3a: Secondary | ICD-10-CM | POA: Diagnosis not present

## 2024-03-06 DIAGNOSIS — R030 Elevated blood-pressure reading, without diagnosis of hypertension: Secondary | ICD-10-CM | POA: Diagnosis not present

## 2024-03-10 ENCOUNTER — Other Ambulatory Visit (HOSPITAL_BASED_OUTPATIENT_CLINIC_OR_DEPARTMENT_OTHER): Payer: Self-pay | Admitting: Family Medicine

## 2024-03-10 DIAGNOSIS — E7800 Pure hypercholesterolemia, unspecified: Secondary | ICD-10-CM

## 2024-03-18 ENCOUNTER — Ambulatory Visit (INDEPENDENT_AMBULATORY_CARE_PROVIDER_SITE_OTHER): Payer: Self-pay

## 2024-03-18 DIAGNOSIS — E7800 Pure hypercholesterolemia, unspecified: Secondary | ICD-10-CM

## 2024-03-26 DIAGNOSIS — E782 Mixed hyperlipidemia: Secondary | ICD-10-CM | POA: Diagnosis not present

## 2024-03-26 DIAGNOSIS — I10 Essential (primary) hypertension: Secondary | ICD-10-CM | POA: Diagnosis not present

## 2024-04-24 DIAGNOSIS — M79601 Pain in right arm: Secondary | ICD-10-CM | POA: Diagnosis not present

## 2024-04-24 DIAGNOSIS — M79602 Pain in left arm: Secondary | ICD-10-CM | POA: Diagnosis not present

## 2024-04-24 DIAGNOSIS — R200 Anesthesia of skin: Secondary | ICD-10-CM | POA: Diagnosis not present

## 2024-04-24 DIAGNOSIS — I10 Essential (primary) hypertension: Secondary | ICD-10-CM | POA: Diagnosis not present

## 2024-04-27 NOTE — Progress Notes (Signed)
 [facility] [address] [place] [address] Phone: [phone] Subjective:   Catherine Fry, am serving as a scribe for Dr. Ronnell Coins.  I'm seeing this patient by the request  of:  System, Provider Not In  CC: bilateral hips   [id number]  Catherine Fry is a 58 y.o. female coming in with complaint of B hip pain. 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 [facility] Dr[facility] Lydia [facility]. Had epidurals through [facility]. Tried PT which was helpful. Takes Meloxicam . Pain glute med, glute and ITB. Use to not be able to lie on her side due to pain but these symptoms have subsided. Was taking gabapentin  but this did not help her pain. MRI lumbar a couple years ago.        Past Medical History:  Diagnosis Date   Atypical mole    moderte right upper buttock   Insomnia    Tachycardia    Tendinopathy of gluteal region    Past Surgical History:  Procedure Laterality Date   OOPHORECTOMY Right    TONSILLECTOMY     Social History   Socioeconomic History   Marital status: Married    Spouse name: Not on file   Number of children: Not on file   Years of education: Not on file   Highest education level: Not on file  Occupational History   Not on file  Tobacco Use   Smoking status: Never   Smokeless tobacco: Never  Vaping Use   Vaping status: Never Used  Substance and Sexual Activity   Alcohol use: Yes    Comment: occasionally   Drug use: No   Sexual activity: Not Currently  Other Topics Concern   Not on file  Social History Narrative   Not on file   Social Drivers of Health   Financial Resource Strain: Low Risk  (09/24/2023)   Received from [facility]   Overall Financial Resource Strain (CARDIA)    Difficulty of Paying Living Expenses: Not hard at all  Food Insecurity: No Food Insecurity (09/24/2023)   Received from [facility]   Hunger Vital Sign    Worried About Running Out of  Food in the Last Year: Never true    Ran Out of Food in the Last Year: Never true  Transportation Needs: No Transportation Needs (09/24/2023)   Received from [facility] - Transportation    Lack of Transportation (Medical): No    Lack of Transportation (Non-Medical): No  Physical Activity: Insufficiently Active (09/24/2023)   Received from [facility]   Exercise Vital Sign    Days of Exercise per Week: 3 days    Minutes of Exercise per Session: 20 min  Stress: Stress Concern Present (09/24/2023)   Received from [facility] - Occupational Stress Questionnaire    Feeling of Stress : To some extent  Social Connections: Socially Integrated (09/24/2023)   Received from [facility]   Social Network    How would you rate your social network (family, work, friends)?: Good participation with social networks   Allergies  Allergen Reactions   Minocycline  Other (See Comments)    Fever, outbreak    Family History  Problem Relation Age of Onset   Cancer Mother        breast   Lymphoma Father    Alzheimer's disease Father        Current Outpatient  Medications (Analgesics):    meloxicam  (MOBIC ) 15 MG tablet, Take 15 mg by mouth daily.   Current Outpatient Medications (Other):    clonazePAM  (KLONOPIN ) 1 MG tablet, Take 2 mg by mouth at bedtime.   escitalopram (LEXAPRO) 10 MG tablet, Take 10 mg by mouth daily.   lamoTRIgine  (LAMICTAL ) 200 MG tablet, Take 200 mg by mouth daily.   pantoprazole  (PROTONIX ) 20 MG tablet, Take 20 mg by mouth daily.   QUEtiapine  (SEROQUEL ) 200 MG tablet, Take 200 mg by mouth at bedtime.   TURMERIC PO, Take by mouth.   Reviewed prior external information including notes and imaging from  primary care provider As well as notes that were available from care everywhere and other healthcare systems.  Past medical history, social, surgical and family history all reviewed in electronic medical record.   No pertanent information unless stated regarding to the chief complaint.   Review of Systems:  No headache, visual changes, nausea, vomiting, diarrhea, constipation, dizziness, abdominal pain, skin rash, fevers, chills, night sweats, weight loss, swollen lymph nodes, , body aches, body aches joint swelling, chest pain, shortness of breath, mood changes. POSITIVE muscle aches, body aches   Objective  Blood pressure 118/82, pulse 68, height 5\' 4"  (1.626 m), weight 158 lb (71.7 kg), SpO2 100%.   General: No apparent distress alert and oriented x3 mood and affect normal, dressed appropriately.  HEENT: Pupils equal, extraocular movements intact  Respiratory: Patient's speak in full sentences and does not appear short of breath  Cardiovascular: No lower extremity edema, non tender, no erythema  Hips exam shows patient does have some loss of range of motion in all planes.  Tenderness to palpation of the gluteal area but left greater than right.  Negative straight leg test noted.  Neurovascular intact distally.  Does have some mild difficulty going from a seated to standing position.  Does have some mild difficulty going from a seated to standing position.    Impression and Recommendations:    The above documentation has been reviewed and is accurate and complete Catherine Boyar M Chesley Valls, DO

## 2024-04-28 ENCOUNTER — Ambulatory Visit: Admitting: Family Medicine

## 2024-04-28 ENCOUNTER — Ambulatory Visit: Payer: Self-pay | Admitting: Family Medicine

## 2024-04-28 VITALS — BP 118/82 | HR 68 | Ht 64.0 in | Wt 158.0 lb

## 2024-04-28 DIAGNOSIS — M25551 Pain in right hip: Secondary | ICD-10-CM | POA: Diagnosis not present

## 2024-04-28 DIAGNOSIS — M2550 Pain in unspecified joint: Secondary | ICD-10-CM | POA: Diagnosis not present

## 2024-04-28 DIAGNOSIS — M25552 Pain in left hip: Secondary | ICD-10-CM

## 2024-04-28 LAB — C-REACTIVE PROTEIN: CRP: <1 mg/dL (ref 0.5–20.0)

## 2024-04-28 LAB — COMPREHENSIVE METABOLIC PANEL WITH GFR
ALT: 15 U/L (ref 0–35)
AST: 19 U/L (ref 0–37)
Albumin: 4.3 g/dL (ref 3.5–5.2)
Alkaline Phosphatase: 57 U/L (ref 39–117)
BUN: 13 mg/dL (ref 6–23)
CO2: 27 meq/L (ref 19–32)
Calcium: 9.7 mg/dL (ref 8.4–10.5)
Chloride: 102 meq/L (ref 96–112)
Creatinine, Ser: 1.02 mg/dL (ref 0.40–1.20)
GFR: 60.23 mL/min (ref >60.00)
Glucose, Bld: 73 mg/dL (ref 70–99)
Potassium: 3.6 meq/L (ref 3.5–5.1)
Sodium: 139 meq/L (ref 135–145)
Total Bilirubin: 0.5 mg/dL (ref 0.2–1.2)
Total Protein: 6.9 g/dL (ref 6.0–8.3)

## 2024-04-28 LAB — CBC WITH DIFFERENTIAL/PLATELET
Basophils Absolute: 0.1 10*3/uL (ref 0.0–0.1)
Basophils Relative: 1.4 % (ref 0.0–3.0)
Eosinophils Absolute: 0.1 10*3/uL (ref 0.0–0.7)
Eosinophils Relative: 2.4 % (ref 0.0–5.0)
HCT: 37.6 % (ref 36.0–46.0)
Hemoglobin: 12.9 g/dL (ref 12.0–15.0)
Lymphocytes Relative: 22.7 % (ref 12.0–46.0)
Lymphs Abs: 1.3 10*3/uL (ref 0.7–4.0)
MCHC: 34.3 g/dL (ref 30.0–36.0)
MCV: 86.5 fl (ref 78.0–100.0)
Monocytes Absolute: 0.2 10*3/uL (ref 0.1–1.0)
Monocytes Relative: 4.2 % (ref 3.0–12.0)
Neutro Abs: 4 10*3/uL (ref 1.4–7.7)
Neutrophils Relative %: 69.3 % (ref 43.0–77.0)
Platelets: 261 10*3/uL (ref 150.0–400.0)
RBC: 4.34 Mil/uL (ref 3.87–5.11)
RDW: 13.4 % (ref 11.5–15.5)
WBC: 5.8 10*3/uL (ref 4.0–10.5)

## 2024-04-28 LAB — VITAMIN B12: Vitamin B-12: 186 pg/mL — ABNORMAL LOW (ref 211–911)

## 2024-04-28 LAB — IBC PANEL
Iron: 81 ug/dL (ref 42–145)
Saturation Ratios: 22.5 % (ref 20.0–50.0)
TIBC: 359.8 ug/dL (ref 250.0–450.0)
Transferrin: 257 mg/dL (ref 212.0–360.0)

## 2024-04-28 LAB — TSH: TSH: 2.15 u[IU]/mL (ref 0.35–5.50)

## 2024-04-28 LAB — SEDIMENTATION RATE: Sed Rate: 8 mm/h (ref 0–30)

## 2024-04-28 LAB — URIC ACID: Uric Acid, Serum: 4.1 mg/dL (ref 2.4–7.0)

## 2024-04-28 LAB — VITAMIN D 25 HYDROXY (VIT D DEFICIENCY, FRACTURES): VITD: 31.75 ng/mL (ref 30.00–100.00)

## 2024-04-28 LAB — FERRITIN: Ferritin: 23.1 ng/mL (ref 10.0–291.0)

## 2024-04-28 NOTE — Assessment & Plan Note (Signed)
 I do believe that it is multifactorial, he does have some arthritic changes of the hip, as well as gluteal tendinopathy.  We discussed different treatment options.  Discussed potential PRP.  Discussed that we should rule out other things such as patient's previous hypercalcemia that could be potentially contributing to some of the aches and pains and any type of deficiency that could also be unfortunately working against patient.  Discussed icing regimen and home exercises otherwise.  Increase activity slowly.  Follow-up again in 6 to 8 weeks.

## 2024-04-28 NOTE — Patient Instructions (Addendum)
 Glute exercises Read about PRP Labs today  See me again in 2 months

## 2024-04-30 LAB — PTH, INTACT AND CALCIUM
Calcium: 9.7 mg/dL (ref 8.6–10.4)
PTH: 76 pg/mL (ref 16–77)

## 2024-05-05 LAB — ANA: Anti Nuclear Antibody (ANA): NEGATIVE

## 2024-05-05 LAB — CYCLIC CITRUL PEPTIDE ANTIBODY, IGG: Cyclic Citrullin Peptide Ab: <16 U

## 2024-05-05 LAB — CALCIUM, IONIZED: Calcium, Ion: 5.3 mg/dL (ref 4.7–5.5)

## 2024-05-05 LAB — RHEUMATOID FACTOR: Rheumatoid fact SerPl-aCnc: <10 [IU]/mL (ref <14)

## 2024-05-05 LAB — PTH-RELATED PEPTIDE: PTH-Related Protein (PTH-RP): 11 pg/mL (ref 11–20)

## 2024-05-05 LAB — ANGIOTENSIN CONVERTING ENZYME: Angiotensin-Converting Enzyme: 19 U/L (ref 9–67)

## 2024-06-04 DIAGNOSIS — R102 Pelvic and perineal pain: Secondary | ICD-10-CM | POA: Diagnosis not present

## 2024-06-04 DIAGNOSIS — N838 Other noninflammatory disorders of ovary, fallopian tube and broad ligament: Secondary | ICD-10-CM | POA: Diagnosis not present

## 2024-06-05 DIAGNOSIS — N838 Other noninflammatory disorders of ovary, fallopian tube and broad ligament: Secondary | ICD-10-CM | POA: Diagnosis not present

## 2024-06-09 DIAGNOSIS — K219 Gastro-esophageal reflux disease without esophagitis: Secondary | ICD-10-CM | POA: Diagnosis not present

## 2024-06-09 DIAGNOSIS — K589 Irritable bowel syndrome without diarrhea: Secondary | ICD-10-CM | POA: Diagnosis not present

## 2024-06-10 ENCOUNTER — Telehealth: Payer: Self-pay

## 2024-06-10 ENCOUNTER — Encounter: Payer: Self-pay | Admitting: Psychiatry

## 2024-06-10 NOTE — Telephone Encounter (Signed)
 Left message for patient .. .trying to schedule her new patient visit for Tuesday 7/8.. with Dr Eldonna.

## 2024-06-10 NOTE — Telephone Encounter (Signed)
 Spoke with Catherine Fry regarding her referral to GYN oncology. She has an appointment scheduled with Dr. Eldonna on 06/16/24 at 9:45. Patient agrees to date and time. She has been provided with office address and location. She is also aware of our mask and visitor policy. Patient verbalized understanding and will call with any questions.

## 2024-06-15 NOTE — H&P (View-Only) (Signed)
 GYNECOLOGIC ONCOLOGY NEW PATIENT CONSULTATION  Date of Service: 06/16/2024 Referring Provider: Perri Bjork, PA-C [address] [place],  [place] [address]   ASSESSMENT AND PLAN: Catherine Fry is a 58 y.o. woman with a complex 3 cm cystic lesion in left adnexa, normal CA125 and CEA.  We reviewed that the exact etiology of the pelvic mass is unclear, but could include a benign, borderline, or malignant process.  The recommended treatment is surgical excision to make a definitive diagnosis.  Given that the cyst has not dramatically increased in size over time and she has had normal tumor markers, we reviewed that this is overall reassuring.  However given the persistent adnexal mass with some complex features in a postmenopausal woman, I do recommend definitive diagnosis with removal. Because the mass is relatively small, we feel that a minimally invasive approach is feasible, using robotic assistance.    In the event of malignancy or borderline tumor on frozen section, we will perform indicated staging procedures. We discussed that these procedures may include total hysterectomy, omentectomy pelvic and/or para-aortic lymphadenectomy, peritoneal biopsies. We would also remove any tissue concerning for metastatic disease which could require additional procedures including bowel surgery.  We additionally reviewed the pros and cons of additional imaging based on patient's questions.  At this time given no free fluid on ultrasound and normal tumor markers, do not feel that we need to proceed with a CT scan.  Additionally, patient with questions regarding pros and cons of MRI.  Reviewed that given some of the abnormal features on her ultrasound, I feel that we will recommend surgery regardless given that she is a safe candidate for surgery and a complex cyst in a postmenopausal woman.  However if she strongly did not want to proceed with surgery or was a poor surgical candidate (she is not),  MRI could be helpful to add to her spine for discussion.  At this time we will hold off on additional imaging.  Patient was consented for: robotic assisted bilateral salpingo-oophorectomy, possible hysterectomy and staging procedures on 07/14/24. (Will remove any residual tube and ovary on right if present but presume is surgically absent).  The risks of surgery were discussed in detail and she understands these to including but not limited to bleeding requiring a blood transfusion, infection, injury to adjacent organs (including but not limited to the bowels, bladder, ureters, nerves, blood vessels), thromboembolic events, wound separation, hernia, vaginal cuff separation, possible risk of lymphedema and lymphocyst if lymphadenectomy performed, unforseen complication, possible need for re-exploration, and medical complications such as heart attack, stroke, pneumonia.  If the patient experiences any of these events, she understands that her hospitalization or recovery may be prolonged and that she may need to take additional medications for a prolonged period. The patient will receive DVT and antibiotic prophylaxis as indicated. She voiced a clear understanding. She had the opportunity to ask questions and informed consent was obtained today. She wishes to proceed.  She will proceed to the lab today for CA199 to complete tumor marker evaluation.  She does not require preoperative clearance. Her METs are >4.  All preoperative instructions were reviewed. Postoperative expectations were also reviewed. Written handouts were provided to the patient.  RTC postop  A copy of this note was sent to the patient's referring provider.  Hoy Masters, MD Gynecologic Oncology   Medical Decision Making I personally spent  TOTAL 60 minutes face-to-face and non-face-to-face in the care of this patient, which includes all pre,  intra, and post visit time on the date of service.   ------------  CC: Adnexal  mass  HISTORY OF PRESENT ILLNESS:  Catherine Fry is a 58 y.o. woman who is seen in consultation at the request of Perri Bjork, PA-C for evaluation of an adnexal mass.  Patient presented to her OB/GYN on 06/04/2024 for follow-up ultrasound for evaluation of an adnexal cystic mass.  She had previously undergone an ultrasound in December 2024 for for evaluation of pelvic pain. She was noted to have a left ovarian cyst measuring 2.5 cm with thick septations and trace vascularity within borders that appear slightly irregular and scalloped. This was then followed up on 02/04/2024 with note of the cystic area measuring 2.72 cm and no thick septations.    CA125 and CEA were collected and reportedly normal.  Plan at that time was for repeat ultrasound in 4 months. Repeat ultrasound on 06/04/2024 was noted to have a left ovary measuring 3.29 cm with a 3 cm simple cystic lesion with thick hyperechoic border, irregular and scalloped. Repeat tumor markers on 06/05/2024 showed a normal CEA 1.6 and normal CA125 at 7.7.  Today patient reports that she was noted to have some tenderness to palpation in her left lower quadrant on exam at her new OB/GYN visit in December 2024 and that was the reason for the initial ultrasound.  She does not believe she was having pelvic symptoms prior to that exam and does not currently have noticeable pelvic symptoms.  She reports stable bloating from her IBS.  She otherwise denies early satiety, significant weight loss, change in bowel or bladder habits.     PAST MEDICAL HISTORY: Past Medical History:  Diagnosis Date   Atypical mole    moderte right upper buttock   Bipolar depression (HCC)    GERD (gastroesophageal reflux disease)    Hypercholesteremia    Hypertension    IBS (irritable bowel syndrome)    Insomnia    Low vitamin B12 level    Primary hyperparathyroidism (HCC)    Tachycardia    Tendinopathy of gluteal region     PAST SURGICAL HISTORY: Past Surgical  History:  Procedure Laterality Date   BREAST CYST EXCISION Right 2025   benign   SALPINGOOPHORECTOMY Right    ovarian teratoma, pfannenstiel   TONSILLECTOMY      OB/GYN HISTORY: OB History  Gravida Para Term Preterm AB Living  0 0 0 0 0 0  SAB IAB Ectopic Multiple Live Births  0 0 0 0 0      Age at menarche: 2 Age at menopause: 40 Hx of HRT: no Hx of STI: no Last pap: NILM, HPV HR neg on 02/11/24 History of abnormal pap smears: no  SCREENING STUDIES:  Last mammogram: 07/2023 Last colonoscopy: 2023  MEDICATIONS:  Current Outpatient Medications:    bisacodyl (DULCOLAX) 5 MG EC tablet, 1 tablet as needed Orally Once a day, Disp: , Rfl:    clonazePAM  (KLONOPIN ) 2 MG tablet, Take 2 mg by mouth daily., Disp: , Rfl:    Cyanocobalamin  (VITAMIN B12) 1000 MCG TABS, 1 tablet Orally Once a day, Disp: , Rfl:    escitalopram (LEXAPRO) 10 MG tablet, Take 10 mg by mouth daily., Disp: , Rfl:    lamoTRIgine  (LAMICTAL ) 200 MG tablet, Take 200 mg by mouth daily., Disp: , Rfl:    meloxicam  (MOBIC ) 15 MG tablet, Take 15 mg by mouth daily. (Patient taking differently: Take 15 mg by mouth daily. As needed), Disp: , Rfl:  metoprolol  tartrate (LOPRESSOR ) 25 MG tablet, Take 25 mg by mouth 2 (two) times daily., Disp: , Rfl:    pantoprazole  (PROTONIX ) 20 MG tablet, Take 20 mg by mouth daily., Disp: , Rfl:    QUEtiapine  (SEROQUEL ) 200 MG tablet, Take 200 mg by mouth at bedtime., Disp: , Rfl:    TURMERIC PO, Take by mouth. Twice daily for inflammation, Disp: , Rfl:   ALLERGIES: Allergies  Allergen Reactions   Minocycline  Other (See Comments)    Fever, outbreak     FAMILY HISTORY: Family History  Problem Relation Age of Onset   Breast cancer Mother    Lymphoma Father    Alzheimer's disease Father    Ovarian cancer Neg Hx    Colon cancer Neg Hx    Endometrial cancer Neg Hx     SOCIAL HISTORY: Social History   Socioeconomic History   Marital status: Married    Spouse name: Not on file    Number of children: Not on file   Years of education: Not on file   Highest education level: Not on file  Occupational History   Not on file  Tobacco Use   Smoking status: Never   Smokeless tobacco: Never  Vaping Use   Vaping status: Never Used  Substance and Sexual Activity   Alcohol use: Yes    Comment: occasionally   Drug use: No   Sexual activity: Not Currently  Other Topics Concern   Not on file  Social History Narrative   Not on file   Social Drivers of Health   Financial Resource Strain: Low Risk  (09/24/2023)   Received from [facility]   Overall Financial Resource Strain (CARDIA)    Difficulty of Paying Living Expenses: Not hard at all  Food Insecurity: No Food Insecurity (06/10/2024)   Hunger Vital Sign    Worried About Running Out of Food in the Last Year: Never true    Ran Out of Food in the Last Year: Never true  Transportation Needs: No Transportation Needs (06/10/2024)   PRAPARE - Administrator, Civil Service (Medical): No    Lack of Transportation (Non-Medical): No  Physical Activity: Insufficiently Active (09/24/2023)   Received from [facility]   Exercise Vital Sign    On average, how many days per week do you engage in moderate to strenuous exercise (like a brisk walk)?: 3 days    On average, how many minutes do you engage in exercise at this level?: 20 min  Stress: Stress Concern Present (09/24/2023)   Received from [facility] - Occupational Stress Questionnaire    Feeling of Stress : To some extent  Social Connections: Socially Integrated (09/24/2023)   Received from [facility]   Social Network    How would you rate your social network (family, work, friends)?: Good participation with social networks  Intimate Partner Violence: Not At Risk (06/10/2024)   Humiliation, Afraid, Rape, and Kick questionnaire    Fear of Current or Ex-Partner: No    Emotionally Abused: No    Physically  Abused: No    Sexually Abused: No    REVIEW OF SYSTEMS: New patient intake form was reviewed.  Complete 10-system review is negative except for the following: Constipation, joint pain, anxiety, mouth sores  PHYSICAL EXAM: BP (!) 136/57   Pulse 77   Temp 98.5 F (36.9 C) (Oral)   Resp 16   Ht 5' 4 (1.626 m)   Wt 157  lb 6.4 oz (71.4 kg)   SpO2 100%   BMI 27.02 kg/m  Constitutional: No acute distress. Neuro/Psych: Alert, oriented.  Head and Neck: Normocephalic, atraumatic. Neck symmetric without masses. Sclera anicteric.  Respiratory: Normal work of breathing. Clear to auscultation bilaterally. Cardiovascular: Regular rate and rhythm, no murmurs, rubs, or gallops. Abdomen: Normoactive bowel sounds. Soft, non-distended, non-tender to palpation. No masses appreciated. Well healed pfannenstiel incision. Extremities: Grossly normal range of motion. Warm, well perfused. No edema bilaterally. Skin: No rashes or lesions. Lymphatic: No cervical, supraclavicular, or inguinal adenopathy. Genitourinary: External genitalia without lesions. Urethral meatus without lesions or prolapse. On speculum exam, vagina and cervix without lesions. Bimanual exam reveals normal cervix and small mobile uterus. Difficult to palpate left adnexa due to pt tolerance of exam.  Exam chaperoned by Eleanor Epps, NP   LABORATORY AND RADIOLOGIC DATA: Outside medical records were reviewed to synthesize the above history, along with the history and physical obtained during the visit.  Outside laboratory, and imaging reports were reviewed, with pertinent results below.  Outside ultrasound images not available for personal review.  WBC  Date Value Ref Range Status  04/28/2024 5.8 4.0 - 10.5 K/uL Final   Hemoglobin  Date Value Ref Range Status  04/28/2024 12.9 12.0 - 15.0 g/dL Final  94/82/7975 86.9 12.0 - 15.0 g/dL Final   HCT  Date Value Ref Range Status  04/28/2024 37.6 36.0 - 46.0 % Final   Platelets  Date  Value Ref Range Status  04/28/2024 261.0 150.0 - 400.0 K/uL Final   Platelet Count  Date Value Ref Range Status  04/26/2023 288 150 - 400 K/uL Final   LDH  Date Value Ref Range Status  04/26/2023 173 98 - 192 U/L Final    Comment:    Performed at [facility] at [facility], [address] [place], [place] [address]   Creatinine  Date Value Ref Range Status  04/26/2023 0.97 0.44 - 1.00 mg/dL Final   Creat  Date Value Ref Range Status  07/20/2021 0.91 0.50 - 1.03 mg/dL Final   Creatinine, Ser  Date Value Ref Range Status  04/28/2024 1.02 0.40 - 1.20 mg/dL Final   AST  Date Value Ref Range Status  04/28/2024 19 0 - 37 U/L Final  04/26/2023 14 (L) 15 - 41 U/L Final   ALT  Date Value Ref Range Status  04/28/2024 15 0 - 35 U/L Final  04/26/2023 11 0 - 44 U/L Final   Diagnosis  Date Value Ref Range Status  11/27/2016   Final   NEGATIVE FOR INTRAEPITHELIAL LESIONS OR MALIGNANCY.  11/27/2016   Final   A LETTER WAS SENT TO THE PATIENT INFORMING HER OF THE ABOVE RESULTS.   HPV  Date Value Ref Range Status  11/27/2016 NOT DETECTED  Final    Comment:    Normal Reference Range - NOT Detected   Pelvic ultrasound (06/04/24): Transabdominal approach. Uterus within normal limits. Endometrium grossly within normal limits.  Suboptimal visualization due to thin nature and transabdominal approach. Cervix within normal limits. Right oophorectomy. Left ovary: Simple cystic lesion with thick hyperechoic border lateral border on today's exam.  Trace of vascularity within hyperechoic border.  Edges appear irregular and scalloped. Cul-de-sac within normal limits

## 2024-06-15 NOTE — Progress Notes (Unsigned)
 GYNECOLOGIC ONCOLOGY NEW PATIENT CONSULTATION  Date of Service: 06/16/2024 Referring Provider: Perri Bjork, PA-C [address] [place],  [place] [address]   ASSESSMENT AND PLAN: Catherine Fry is a 58 y.o. woman with a complex 3 cm cystic lesion in left adnexa, normal CA125 and CEA.  We reviewed that the exact etiology of the pelvic mass is unclear, but could include a benign, borderline, or malignant process.  The recommended treatment is surgical excision to make a definitive diagnosis.  Given that the cyst has not dramatically increased in size over time and she has had normal tumor markers, we reviewed that this is overall reassuring.  However given the persistent adnexal mass with some complex features in a postmenopausal woman, I do recommend definitive diagnosis with removal. Because the mass is relatively small, we feel that a minimally invasive approach is feasible, using robotic assistance.    In the event of malignancy or borderline tumor on frozen section, we will perform indicated staging procedures. We discussed that these procedures may include total hysterectomy, omentectomy pelvic and/or para-aortic lymphadenectomy, peritoneal biopsies. We would also remove any tissue concerning for metastatic disease which could require additional procedures including bowel surgery.  We additionally reviewed the pros and cons of additional imaging based on patient's questions.  At this time given no free fluid on ultrasound and normal tumor markers, do not feel that we need to proceed with a CT scan.  Additionally, patient with questions regarding pros and cons of MRI.  Reviewed that given some of the abnormal features on her ultrasound, I feel that we will recommend surgery regardless given that she is a safe candidate for surgery and a complex cyst in a postmenopausal woman.  However if she strongly did not want to proceed with surgery or was a poor surgical candidate (she is not),  MRI could be helpful to add to her spine for discussion.  At this time we will hold off on additional imaging.  Patient was consented for: robotic assisted bilateral salpingo-oophorectomy, possible hysterectomy and staging procedures on 07/14/24. (Will remove any residual tube and ovary on right if present but presume is surgically absent).  The risks of surgery were discussed in detail and she understands these to including but not limited to bleeding requiring a blood transfusion, infection, injury to adjacent organs (including but not limited to the bowels, bladder, ureters, nerves, blood vessels), thromboembolic events, wound separation, hernia, vaginal cuff separation, possible risk of lymphedema and lymphocyst if lymphadenectomy performed, unforseen complication, possible need for re-exploration, and medical complications such as heart attack, stroke, pneumonia.  If the patient experiences any of these events, she understands that her hospitalization or recovery may be prolonged and that she may need to take additional medications for a prolonged period. The patient will receive DVT and antibiotic prophylaxis as indicated. She voiced a clear understanding. She had the opportunity to ask questions and informed consent was obtained today. She wishes to proceed.  She will proceed to the lab today for CA199 to complete tumor marker evaluation.  She does not require preoperative clearance. Her METs are >4.  All preoperative instructions were reviewed. Postoperative expectations were also reviewed. Written handouts were provided to the patient.  RTC postop  A copy of this note was sent to the patient's referring provider.  Hoy Masters, MD Gynecologic Oncology   Medical Decision Making I personally spent  TOTAL 60 minutes face-to-face and non-face-to-face in the care of this patient, which includes all pre,  intra, and post visit time on the date of service.   ------------  CC: Adnexal  mass  HISTORY OF PRESENT ILLNESS:  Catherine Fry is a 58 y.o. woman who is seen in consultation at the request of Perri Bjork, PA-C for evaluation of an adnexal mass.  Patient presented to her OB/GYN on 06/04/2024 for follow-up ultrasound for evaluation of an adnexal cystic mass.  She had previously undergone an ultrasound in December 2024 for for evaluation of pelvic pain. She was noted to have a left ovarian cyst measuring 2.5 cm with thick septations and trace vascularity within borders that appear slightly irregular and scalloped. This was then followed up on 02/04/2024 with note of the cystic area measuring 2.72 cm and no thick septations.    CA125 and CEA were collected and reportedly normal.  Plan at that time was for repeat ultrasound in 4 months. Repeat ultrasound on 06/04/2024 was noted to have a left ovary measuring 3.29 cm with a 3 cm simple cystic lesion with thick hyperechoic border, irregular and scalloped. Repeat tumor markers on 06/05/2024 showed a normal CEA 1.6 and normal CA125 at 7.7.  Today patient reports that she was noted to have some tenderness to palpation in her left lower quadrant on exam at her new OB/GYN visit in December 2024 and that was the reason for the initial ultrasound.  She does not believe she was having pelvic symptoms prior to that exam and does not currently have noticeable pelvic symptoms.  She reports stable bloating from her IBS.  She otherwise denies early satiety, significant weight loss, change in bowel or bladder habits.     PAST MEDICAL HISTORY: Past Medical History:  Diagnosis Date   Atypical mole    moderte right upper buttock   Bipolar depression (HCC)    GERD (gastroesophageal reflux disease)    Hypercholesteremia    Hypertension    IBS (irritable bowel syndrome)    Insomnia    Low vitamin B12 level    Primary hyperparathyroidism (HCC)    Tachycardia    Tendinopathy of gluteal region     PAST SURGICAL HISTORY: Past Surgical  History:  Procedure Laterality Date   BREAST CYST EXCISION Right 2025   benign   SALPINGOOPHORECTOMY Right    ovarian teratoma, pfannenstiel   TONSILLECTOMY      OB/GYN HISTORY: OB History  Gravida Para Term Preterm AB Living  0 0 0 0 0 0  SAB IAB Ectopic Multiple Live Births  0 0 0 0 0      Age at menarche: 51 Age at menopause: 58 Hx of HRT: no Hx of STI: no Last pap: NILM, HPV HR neg on 02/11/24 History of abnormal pap smears: no  SCREENING STUDIES:  Last mammogram: 07/2023 Last colonoscopy: 2023  MEDICATIONS:  Current Outpatient Medications:    bisacodyl (DULCOLAX) 5 MG EC tablet, 1 tablet as needed Orally Once a day, Disp: , Rfl:    clonazePAM  (KLONOPIN ) 2 MG tablet, Take 2 mg by mouth daily., Disp: , Rfl:    Cyanocobalamin  (VITAMIN B12) 1000 MCG TABS, 1 tablet Orally Once a day, Disp: , Rfl:    escitalopram (LEXAPRO) 10 MG tablet, Take 10 mg by mouth daily., Disp: , Rfl:    lamoTRIgine  (LAMICTAL ) 200 MG tablet, Take 200 mg by mouth daily., Disp: , Rfl:    meloxicam  (MOBIC ) 15 MG tablet, Take 15 mg by mouth daily. (Patient taking differently: Take 15 mg by mouth daily. As needed), Disp: , Rfl:  metoprolol  tartrate (LOPRESSOR ) 25 MG tablet, Take 25 mg by mouth 2 (two) times daily., Disp: , Rfl:    pantoprazole  (PROTONIX ) 20 MG tablet, Take 20 mg by mouth daily., Disp: , Rfl:    QUEtiapine  (SEROQUEL ) 200 MG tablet, Take 200 mg by mouth at bedtime., Disp: , Rfl:    TURMERIC PO, Take by mouth. Twice daily for inflammation, Disp: , Rfl:   ALLERGIES: Allergies  Allergen Reactions   Minocycline  Other (See Comments)    Fever, outbreak     FAMILY HISTORY: Family History  Problem Relation Age of Onset   Breast cancer Mother    Lymphoma Father    Alzheimer's disease Father    Ovarian cancer Neg Hx    Colon cancer Neg Hx    Endometrial cancer Neg Hx     SOCIAL HISTORY: Social History   Socioeconomic History   Marital status: Married    Spouse name: Not on file    Number of children: Not on file   Years of education: Not on file   Highest education level: Not on file  Occupational History   Not on file  Tobacco Use   Smoking status: Never   Smokeless tobacco: Never  Vaping Use   Vaping status: Never Used  Substance and Sexual Activity   Alcohol use: Yes    Comment: occasionally   Drug use: No   Sexual activity: Not Currently  Other Topics Concern   Not on file  Social History Narrative   Not on file   Social Drivers of Health   Financial Resource Strain: Low Risk  (09/24/2023)   Received from [facility]   Overall Financial Resource Strain (CARDIA)    Difficulty of Paying Living Expenses: Not hard at all  Food Insecurity: No Food Insecurity (06/10/2024)   Hunger Vital Sign    Worried About Running Out of Food in the Last Year: Never true    Ran Out of Food in the Last Year: Never true  Transportation Needs: No Transportation Needs (06/10/2024)   PRAPARE - Administrator, Civil Service (Medical): No    Lack of Transportation (Non-Medical): No  Physical Activity: Insufficiently Active (09/24/2023)   Received from [facility]   Exercise Vital Sign    On average, how many days per week do you engage in moderate to strenuous exercise (like a brisk walk)?: 3 days    On average, how many minutes do you engage in exercise at this level?: 20 min  Stress: Stress Concern Present (09/24/2023)   Received from [facility] Occup[facility] - Occupational Stress Questionnaire    Feeling of Stress : To some extent  Social Connections: Socially Integrated (09/24/2023)   Received from [facility]   Social Network    How would you rate your social network (family, work, friends)?: Good participation with social networks  Intimate Partner Violence: Not At Risk (06/10/2024)   Humiliation, Afraid, Rape, and Kick questionnaire    Fear of Current or Ex-Partner: No    Emotionally Abused: No    Physically  Abused: No    Sexually Abused: No    REVIEW OF SYSTEMS: New patient intake form was reviewed.  Complete 10-system review is negative except for the following: Constipation, joint pain, anxiety, mouth sores  PHYSICAL EXAM: BP (!) 136/57   Pulse 77   Temp 98.5 F (36.9 C) (Oral)   Resp 16   Ht 5' 4 (1.626 m)   Wt 157  lb 6.4 oz (71.4 kg)   SpO2 100%   BMI 27.02 kg/m  Constitutional: No acute distress. Neuro/Psych: Alert, oriented.  Head and Neck: Normocephalic, atraumatic. Neck symmetric without masses. Sclera anicteric.  Respiratory: Normal work of breathing. Clear to auscultation bilaterally. Cardiovascular: Regular rate and rhythm, no murmurs, rubs, or gallops. Abdomen: Normoactive bowel sounds. Soft, non-distended, non-tender to palpation. No masses appreciated. Well healed pfannenstiel incision. Extremities: Grossly normal range of motion. Warm, well perfused. No edema bilaterally. Skin: No rashes or lesions. Lymphatic: No cervical, supraclavicular, or inguinal adenopathy. Genitourinary: External genitalia without lesions. Urethral meatus without lesions or prolapse. On speculum exam, vagina and cervix without lesions. Bimanual exam reveals normal cervix and small mobile uterus. Difficult to palpate left adnexa due to pt tolerance of exam.  Exam chaperoned by Eleanor Epps, NP   LABORATORY AND RADIOLOGIC DATA: Outside medical records were reviewed to synthesize the above history, along with the history and physical obtained during the visit.  Outside laboratory, and imaging reports were reviewed, with pertinent results below.  Outside ultrasound images not available for personal review.  WBC  Date Value Ref Range Status  04/28/2024 5.8 4.0 - 10.5 K/uL Final   Hemoglobin  Date Value Ref Range Status  04/28/2024 12.9 12.0 - 15.0 g/dL Final  94/82/7975 86.9 12.0 - 15.0 g/dL Final   HCT  Date Value Ref Range Status  04/28/2024 37.6 36.0 - 46.0 % Final   Platelets  Date  Value Ref Range Status  04/28/2024 261.0 150.0 - 400.0 K/uL Final   Platelet Count  Date Value Ref Range Status  04/26/2023 288 150 - 400 K/uL Final   LDH  Date Value Ref Range Status  04/26/2023 173 98 - 192 U/L Final    Comment:    Performed at [facility] at [facility], [address] [place], [place] [address]   Creatinine  Date Value Ref Range Status  04/26/2023 0.97 0.44 - 1.00 mg/dL Final   Creat  Date Value Ref Range Status  07/20/2021 0.91 0.50 - 1.03 mg/dL Final   Creatinine, Ser  Date Value Ref Range Status  04/28/2024 1.02 0.40 - 1.20 mg/dL Final   AST  Date Value Ref Range Status  04/28/2024 19 0 - 37 U/L Final  04/26/2023 14 (L) 15 - 41 U/L Final   ALT  Date Value Ref Range Status  04/28/2024 15 0 - 35 U/L Final  04/26/2023 11 0 - 44 U/L Final   Diagnosis  Date Value Ref Range Status  11/27/2016   Final   NEGATIVE FOR INTRAEPITHELIAL LESIONS OR MALIGNANCY.  11/27/2016   Final   A LETTER WAS SENT TO THE PATIENT INFORMING HER OF THE ABOVE RESULTS.   HPV  Date Value Ref Range Status  11/27/2016 NOT DETECTED  Final    Comment:    Normal Reference Range - NOT Detected   Pelvic ultrasound (06/04/24): Transabdominal approach. Uterus within normal limits. Endometrium grossly within normal limits.  Suboptimal visualization due to thin nature and transabdominal approach. Cervix within normal limits. Right oophorectomy. Left ovary: Simple cystic lesion with thick hyperechoic border lateral border on today's exam.  Trace of vascularity within hyperechoic border.  Edges appear irregular and scalloped. Cul-de-sac within normal limits

## 2024-06-16 ENCOUNTER — Inpatient Hospital Stay: Attending: Psychiatry | Admitting: Psychiatry

## 2024-06-16 ENCOUNTER — Inpatient Hospital Stay: Admitting: Gynecologic Oncology

## 2024-06-16 ENCOUNTER — Encounter: Payer: Self-pay | Admitting: Psychiatry

## 2024-06-16 ENCOUNTER — Inpatient Hospital Stay

## 2024-06-16 VITALS — BP 136/57 | HR 77 | Temp 98.5°F | Resp 16 | Ht 64.0 in | Wt 157.4 lb

## 2024-06-16 DIAGNOSIS — F319 Bipolar disorder, unspecified: Secondary | ICD-10-CM | POA: Insufficient documentation

## 2024-06-16 DIAGNOSIS — D3912 Neoplasm of uncertain behavior of left ovary: Secondary | ICD-10-CM | POA: Diagnosis not present

## 2024-06-16 DIAGNOSIS — Z791 Long term (current) use of non-steroidal anti-inflammatories (NSAID): Secondary | ICD-10-CM | POA: Insufficient documentation

## 2024-06-16 DIAGNOSIS — Z79899 Other long term (current) drug therapy: Secondary | ICD-10-CM | POA: Diagnosis not present

## 2024-06-16 DIAGNOSIS — I10 Essential (primary) hypertension: Secondary | ICD-10-CM | POA: Insufficient documentation

## 2024-06-16 DIAGNOSIS — K589 Irritable bowel syndrome without diarrhea: Secondary | ICD-10-CM | POA: Diagnosis not present

## 2024-06-16 DIAGNOSIS — N9489 Other specified conditions associated with female genital organs and menstrual cycle: Secondary | ICD-10-CM

## 2024-06-16 DIAGNOSIS — E7800 Pure hypercholesterolemia, unspecified: Secondary | ICD-10-CM | POA: Diagnosis not present

## 2024-06-16 DIAGNOSIS — K219 Gastro-esophageal reflux disease without esophagitis: Secondary | ICD-10-CM | POA: Insufficient documentation

## 2024-06-16 DIAGNOSIS — E210 Primary hyperparathyroidism: Secondary | ICD-10-CM | POA: Insufficient documentation

## 2024-06-16 DIAGNOSIS — Z86018 Personal history of other benign neoplasm: Secondary | ICD-10-CM | POA: Insufficient documentation

## 2024-06-16 MED ORDER — OXYCODONE HCL 5 MG PO TABS: 5.0000 mg | ORAL_TABLET | ORAL | 0 refills | Status: DC | PRN

## 2024-06-16 MED ORDER — SENNOSIDES-DOCUSATE SODIUM 8.6-50 MG PO TABS: 2.0000 | ORAL_TABLET | Freq: Every day | ORAL | 0 refills | Status: DC

## 2024-06-16 NOTE — Progress Notes (Signed)
 Patient here for a consult with Dr. Eldonna and for a pre-operative appointment prior to her scheduled surgery on 07/14/2024. She is scheduled for a robotic assisted laparoscopic unilateral salpingo-oophorectomy, possible robotic assisted total hysterectomy, possible staging if a precancer or cancer is seen. . The surgery was discussed in detail.  See after visit summary for additional details.       Discussed post-op pain management in detail including the aspects of the enhanced recovery pathway.  Advised her that a new prescription would be sent in and it is only to be used for after her upcoming surgery.  We discussed the use of tylenol post-op and to monitor for a maximum of 4,000 mg in a 24 hour period.  Also let her know that Senakot will be prescribed to be used after surgery and to hold if having loose stools.  Discussed bowel regimen in detail.     Discussed the use of SCDs and measures to take at home to prevent DVT including frequent mobility.  Reportable signs and symptoms of DVT discussed. Post-operative instructions discussed and expectations for after surgery. Incisional care discussed as well including reportable signs and symptoms including erythema, drainage, wound separation.     30 minutes spent with the patient.  Verbalizing understanding of material discussed. No needs or concerns voiced at the end of the visit.   Advised patient to call for any needs.  Advised that her post-operative medications had been prescribed and could be picked up at any time.    This appointment is included in the global surgical bundle as pre-operative teaching and has no charge.

## 2024-06-16 NOTE — Patient Instructions (Addendum)
 Preparing for your Surgery  Plan for surgery on July 14, 2024 with Dr. Hoy Masters at [facility]. You will be scheduled for robotic assisted laparoscopic bilateral salpingo-oophorectomy, possible robotic assisted total hysterectomy, possible staging if a precancer or cancer is seen.   Pre-operative Testing -You will receive a phone call from presurgical testing at [facility] to arrange for a pre-operative appointment and lab work.  -Bring your insurance card, copy of an advanced directive if applicable, medication list  -At that visit, you will be asked to sign a consent for a possible blood transfusion in case a transfusion becomes necessary during surgery.  The need for a blood transfusion is rare but having consent is a necessary part of your care.     -You should not be taking blood thinners or aspirin at least ten days prior to surgery unless instructed by your surgeon.  -STOP TURMERIC AT LEAST 10 DAYS BEFORE. Do not take supplements such as fish oil (omega 3), red yeast rice, turmeric before your surgery. STOP TAKING AT LEAST 10 DAYS BEFORE SURGERY. You want to avoid medications with aspirin in them including headache powders such as BC or Goody's), Excedrin migraine.  Day Before Surgery at Home -You will be asked to take in a light diet the day before surgery. You will be advised you can have clear liquids up until 3 hours before your surgery.    Eat a light diet the day before surgery.  Examples including soups, broths, toast, yogurt, mashed potatoes.  AVOID GAS PRODUCING FOODS AND BEVERAGES. Things to avoid include carbonated beverages (fizzy beverages, sodas), raw fruits and raw vegetables (uncooked), or beans.   If your bowels are filled with gas, your surgeon will have difficulty visualizing your pelvic organs which increases your surgical risks.  Your role in recovery Your role is to become active as soon as directed by your doctor, while still giving  yourself time to heal.  Rest when you feel tired. You will be asked to do the following in order to speed your recovery:  - Cough and breathe deeply. This helps to clear and expand your lungs and can prevent pneumonia after surgery.  - STAY ACTIVE WHEN YOU GET HOME. Do mild physical activity. Walking or moving your legs help your circulation and body functions return to normal. Do not try to get up or walk alone the first time after surgery.   -If you develop swelling on one leg or the other, pain in the back of your leg, redness/warmth in one of your legs, please call the office or go to the Emergency Room to have a doppler to rule out a blood clot. For shortness of breath, chest pain-seek care in the Emergency Room as soon as possible. - Actively manage your pain. Managing your pain lets you move in comfort. We will ask you to rate your pain on a scale of zero to 10. It is your responsibility to tell your doctor or nurse where and how much you hurt so your pain can be treated.  Special Considerations -If you are diabetic, you may be placed on insulin after surgery to have closer control over your blood sugars to promote healing and recovery.  This does not mean that you will be discharged on insulin.  If applicable, your oral antidiabetics will be resumed when you are tolerating a solid diet.  -Your final pathology results from surgery should be available around one week after surgery and the results will be  relayed to you when available.  -FMLA forms can be faxed to [phone] and please allow 5-7 business days for completion.  Pain Management After Surgery -You will be prescribed your pain medication and bowel regimen medications before surgery so that you can have these available when you are discharged from the hospital. The pain medication is for use ONLY AFTER surgery and a new prescription will not be given.   -Make sure that you have Tylenol and Ibuprofen (OR MOBIC , DO NOT TAKE TOGETHER  SINCE THEY WORK SIMILARLY) IF YOU ARE ABLE TO TAKE THESE MEDICATIONS at home to use on a regular basis after surgery for pain control. We recommend alternating the medications every hour to six hours since they work differently and are processed in the body differently for pain relief.  -Review the attached handout on narcotic use and their risks and side effects.   Bowel Regimen -You will be prescribed Sennakot-S to take nightly to prevent constipation especially if you are taking the narcotic pain medication intermittently.  It is important to prevent constipation and drink adequate amounts of liquids. You can stop taking this medication when you are not taking pain medication and you are back on your normal bowel routine.  Risks of Surgery Risks of surgery are low but include bleeding, infection, damage to surrounding structures, re-operation, blood clots, and very rarely death.   Blood Transfusion Information (For the consent to be signed before surgery)  We will be checking your blood type before surgery so in case of emergencies, we will know what type of blood you would need.                                            WHAT IS A BLOOD TRANSFUSION?  A transfusion is the replacement of blood or some of its parts. Blood is made up of multiple cells which provide different functions. Red blood cells carry oxygen and are used for blood loss replacement. White blood cells fight against infection. Platelets control bleeding. Plasma helps clot blood. Other blood products are available for specialized needs, such as hemophilia or other clotting disorders. BEFORE THE TRANSFUSION  Who gives blood for transfusions?  You may be able to donate blood to be used at a later date on yourself (autologous donation). Relatives can be asked to donate blood. This is generally not any safer than if you have received blood from a stranger. The same precautions are taken to ensure safety when a relative's  blood is donated. Healthy volunteers who are fully evaluated to make sure their blood is safe. This is blood bank blood. Transfusion therapy is the safest it has ever been in the practice of medicine. Before blood is taken from a donor, a complete history is taken to make sure that person has no history of diseases nor engages in risky social behavior (examples are intravenous drug use or sexual activity with multiple partners). The donor's travel history is screened to minimize risk of transmitting infections, such as malaria. The donated blood is tested for signs of infectious diseases, such as HIV and hepatitis. The blood is then tested to be sure it is compatible with you in order to minimize the chance of a transfusion reaction. If you or a relative donates blood, this is often done in anticipation of surgery and is not appropriate for emergency situations. It takes many days to  process the donated blood. RISKS AND COMPLICATIONS Although transfusion therapy is very safe and saves many lives, the main dangers of transfusion include:  Getting an infectious disease. Developing a transfusion reaction. This is an allergic reaction to something in the blood you were given. Every precaution is taken to prevent this. The decision to have a blood transfusion has been considered carefully by your caregiver before blood is given. Blood is not given unless the benefits outweigh the risks.  AFTER SURGERY INSTRUCTIONS  Return to work: 4-6 weeks if applicable  Activity: 1. Be up and out of the bed during the day.  Take a nap if needed.  You may walk up steps but be careful and use the hand rail.  Stair climbing will tire you more than you think, you may need to stop part way and rest.   2. No lifting or straining for 6 weeks over 10 pounds. No pushing, pulling, straining for 6 weeks.  3. No driving for 4-89 days when the following criteria have been met: Do not drive if you are taking narcotic pain medicine  and make sure that your reaction time has returned.   4. You can shower as soon as the next day after surgery. Shower daily.  Use your regular soap and water (not directly on the incision) and pat your incision(s) dry afterwards; don't rub.  No tub baths or submerging your body in water until cleared by your surgeon. If you have the soap that was given to you by pre-surgical testing that was used before surgery, you do not need to use it afterwards because this can irritate your incisions.   5. No sexual activity and nothing in the vagina for 6 weeks, 12 weeks if you have a hysterectomy (removal of the uterus and cervix).  6. You may experience a small amount of clear drainage from your incisions, which is normal.  If the drainage persists, increases, or changes color please call the office.  7. Do not use creams, lotions, or ointments such as neosporin on your incisions after surgery until advised by your surgeon because they can cause removal of the dermabond glue on your incisions.    8. You may experience vaginal spotting after surgery or when the stitches at the top of the vagina begin to dissolve.  The spotting is normal but if you experience heavy bleeding, call our office.  9. Take Tylenol or ibuprofen (OR MOBIC , DO NOT TAKE TOGETHER) first for pain if you are able to take these medications and only use narcotic pain medication for severe pain not relieved by the Tylenol or Ibuprofen.  Monitor your Tylenol intake to a max of 4,000 mg in a 24 hour period. You can alternate these medications after surgery.  Diet: 1. Low sodium Heart Healthy Diet is recommended but you are cleared to resume your normal (before surgery) diet after your procedure.  2. It is safe to use a laxative, such as Miralax or Colace, if you have difficulty moving your bowels before surgery. You have been prescribed Sennakot-S to take at bedtime every evening after surgery to keep bowel movements regular and to prevent  constipation.    Wound Care: 1. Keep clean and dry.  Shower daily.  Reasons to call the Doctor: Fever - Oral temperature greater than 100.4 degrees Fahrenheit Foul-smelling vaginal discharge Difficulty urinating Nausea and vomiting Increased pain at the site of the incision that is unrelieved with pain medicine. Difficulty breathing with or without chest pain New  calf pain especially if only on one side Sudden, continuing increased vaginal bleeding with or without clots.   Contacts: For questions or concerns you should contact:  Dr. Hoy Masters at [phone]  Eleanor Epps, NP at [phone]  After Hours: call [phone] and have the GYN Oncologist paged/contacted (after 5 pm or on the weekends). You will speak with an after hours RN and let he or she know you have had surgery.  Messages sent via mychart are for non-urgent matters and are not responded to after hours so for urgent needs, please call the after hours number.

## 2024-06-17 ENCOUNTER — Encounter: Payer: Self-pay | Admitting: Gynecologic Oncology

## 2024-06-17 ENCOUNTER — Encounter: Payer: Self-pay | Admitting: Obstetrics and Gynecology

## 2024-06-17 ENCOUNTER — Ambulatory Visit: Payer: Self-pay | Admitting: Psychiatry

## 2024-06-17 DIAGNOSIS — N9489 Other specified conditions associated with female genital organs and menstrual cycle: Secondary | ICD-10-CM | POA: Insufficient documentation

## 2024-06-17 DIAGNOSIS — C7A098 Malignant carcinoid tumors of other sites: Secondary | ICD-10-CM | POA: Insufficient documentation

## 2024-06-17 LAB — CANCER ANTIGEN 19-9: CA 19-9: 5 U/mL (ref 0–35)

## 2024-06-25 NOTE — Progress Notes (Signed)
 [facility] [address] [place] [address] Phone: [phone] Subjective:   Catherine Fry, am serving as a scribe for Dr. Arthea Claudene.  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  We discussed different treatment options.  Discussed potential PRP.  Discussed that we should rule out other things such as patient's previous hypercalcemia that could be potentially contributing to some of the aches and pains and any type of deficiency that could also be unfortunately working against patient.  Discussed icing regimen and home exercises otherwise.  Increase activity slowly.  Follow-up again in 6 to 8 weeks.      Update 06/30/2024 Catherine Fry is a 58 y.o. female coming in with complaint of B hip pain. Patient states that she feels like B12 is helping. Has been more active lately and this has increased pain in her hips.   Having ovary removed in 2 weeks from now.   Labs normal except B12 186  Gyn surgery 8/5   Past Medical History:  Diagnosis Date   Atypical mole    moderte right upper buttock   Bipolar depression (HCC)    GERD (gastroesophageal reflux disease)    Hypercholesteremia    Hypertension    IBS (irritable bowel syndrome)    Insomnia    Low vitamin B12 level    Primary hyperparathyroidism (HCC)    Tachycardia    Tendinopathy of gluteal region    Past Surgical History:  Procedure Laterality Date   BREAST CYST EXCISION Right 2025   benign   SALPINGOOPHORECTOMY Right    ovarian teratoma, pfannenstiel   TONSILLECTOMY     Social History   Socioeconomic History   Marital status: Married    Spouse name: Not on file   Number of children: Not on file   Years of education: Not on file   Highest education level: Not on file   Occupational History   Not on file  Tobacco Use   Smoking status: Never   Smokeless tobacco: Never  Vaping Use   Vaping status: Never Used  Substance and Sexual Activity   Alcohol use: Yes    Comment: occasionally   Drug use: No   Sexual activity: Not Currently  Other Topics Concern   Not on file  Social History Narrative   Not on file   Social Drivers of Health   Financial Resource Strain: Low Risk  (09/24/2023)   Received from [facility]   Overall Financial Resource Strain (CARDIA)    Difficulty of Paying Living Expenses: Not hard at all  Food Insecurity: No Food Insecurity (06/10/2024)   Hunger Vital Sign    Worried About Running Out of Food in the Last Year: Never true    Ran Out of Food in the Last Year: Never true  Transportation Needs: No Transportation Needs (06/10/2024)   PRAPARE - Administrator, Civil Service (Medical): No    Lack of Transportation (Non-Medical): No  Physical Activity: Insufficiently Active (09/24/2023)   Received from [facility]   Exercise Vital Sign    On average, how many days per week do you engage in moderate to strenuous exercise (like a brisk walk)?: 3 days    On average, how many minutes do you engage  in exercise at this level?: 20 min  Stress: Stress Concern Present (09/24/2023)   Received from [facility] - Occupational Stress Questionnaire    Feeling of Stress : To some extent  Social Connections: Socially Integrated (09/24/2023)   Received from [facility]   Social Network    How would you rate your social network (family, work, friends)?: Good participation with social networks   Allergies  Allergen Reactions   Minocycline  Other (See Comments)    Fever, outbreak    Family History  Problem Relation Age of Onset   Breast cancer Mother    Lymphoma Father    Alzheimer's disease Father    Ovarian cancer Neg Hx    Colon cancer Neg Hx    Endometrial cancer Neg Hx       Current Outpatient Medications (Cardiovascular):    metoprolol  tartrate (LOPRESSOR ) 25 MG tablet, Take 25 mg by mouth 2 (two) times daily.   Current Outpatient Medications (Analgesics):    meloxicam  (MOBIC ) 15 MG tablet, Take 15 mg by mouth daily. (Patient taking differently: Take 15 mg by mouth daily. As needed)   oxyCODONE  (OXY IR/ROXICODONE ) 5 MG immediate release tablet, Take 1 tablet (5 mg total) by mouth every 4 (four) hours as needed for severe pain (pain score 7-10). For AFTER surgery only, do not take and drive  Current Outpatient Medications (Hematological):    Cyanocobalamin  (VITAMIN B12) 1000 MCG TABS, 1 tablet Orally Once a day  Current Outpatient Medications (Other):    bisacodyl (DULCOLAX) 5 MG EC tablet, 1 tablet as needed Orally Once a day   clonazePAM  (KLONOPIN ) 2 MG tablet, Take 2 mg by mouth daily.   escitalopram (LEXAPRO) 10 MG tablet, Take 10 mg by mouth daily.   lamoTRIgine  (LAMICTAL ) 200 MG tablet, Take 200 mg by mouth daily.   pantoprazole  (PROTONIX ) 20 MG tablet, Take 20 mg by mouth daily.   QUEtiapine  (SEROQUEL ) 200 MG tablet, Take 200 mg by mouth at bedtime.   senna-docusate (SENOKOT-S) 8.6-50 MG tablet, Take 2 tablets by mouth at bedtime. For AFTER surgery, do not take if having diarrhea   TURMERIC PO, Take by mouth. Twice daily for inflammation   Reviewed prior external information including notes and imaging from  primary care provider As well as notes that were available from care everywhere and other healthcare systems.  Past medical history, social, surgical and family history all reviewed in electronic medical record.  No pertanent information unless stated regarding to the chief complaint.   Review of Systems:  No headache, visual changes, nausea, vomiting, diarrhea, constipation, dizziness, abdominal pain, skin rash, fevers, chills, night sweats, weight loss, swollen lymph nodes, body aches, joint swelling, chest pain, shortness of breath,  mood changes. POSITIVE muscle aches  Objective  There were no vitals taken for this visit.   General: No apparent distress alert and oriented x3 mood and affect normal, dressed appropriately.  HEENT: Pupils equal, extraocular movements intact  Respiratory: Patient's speak in full sentences and does not appear short of breath  Cardiovascular: No lower extremity edema, non tender, no erythema  Hip exam shows severe tenderness to palpation over the gluteal tendons as well as over the greater trochanteric area.  To be severe bilaterally maybe a little on the right greater than the left but difficult to assess.  Negative straight leg test.  No significant back pain at the moment.  Procedure: Real-time Ultrasound Guided Injection of right greater trochanteric bursitis secondary to  patient's body habitus Device: GE Logiq Q7 Ultrasound guided injection is preferred based studies that show increased duration, increased effect, greater accuracy, decreased procedural pain, increased response rate, and decreased cost with ultrasound guided versus blind injection.  Verbal informed consent obtained.  Time-out conducted.  Noted no overlying erythema, induration, or other signs of local infection.  Skin prepped in a sterile fashion.  Local anesthesia: Topical Ethyl chloride.  With sterile technique and under real time ultrasound guidance:  Greater trochanteric area was visualized and patient's bursa was noted. A 22-gauge 3 inch needle was inserted and 4 cc of 0.5% Marcaine and 1 cc of Kenalog  40 mg/dL was injected. Pictures taken Completed without difficulty  Pain immediately resolved suggesting accurate placement of the medication.  Advised to call if fevers/chills, erythema, induration, drainage, or persistent bleeding.  Images permanently stored  Impression: Technically successful ultrasound guided injection.   Procedure: Real-time Ultrasound Guided Injection of left  greater trochanteric bursitis  secondary to patient's body habitus Device: GE Logiq Q7  Ultrasound guided injection is preferred based studies that show increased duration, increased effect, greater accuracy, decreased procedural pain, increased response rate, and decreased cost with ultrasound guided versus blind injection.  Verbal informed consent obtained.  Time-out conducted.  Noted no overlying erythema, induration, or other signs of local infection.  Skin prepped in a sterile fashion.  Local anesthesia: Topical Ethyl chloride.  With sterile technique and under real time ultrasound guidance:  Greater trochanteric area was visualized and patient's bursa was noted. A 22-gauge 3 inch needle was inserted and 4 cc of 0.5% Marcaine and 1 cc of Kenalog  40 mg/dL was injected. Pictures taken Completed without difficulty  Pain immediately resolved suggesting accurate placement of the medication.  Advised to call if fevers/chills, erythema, induration, drainage, or persistent bleeding.  Images permanently stored  Impression: Technically successful ultrasound guided injection.    Impression and Recommendations:    The above documentation has been reviewed and is accurate and complete Catherine Mccasland M Elodia Haviland, DO

## 2024-06-30 ENCOUNTER — Ambulatory Visit: Admitting: Family Medicine

## 2024-06-30 ENCOUNTER — Ambulatory Visit: Payer: Self-pay | Admitting: Family Medicine

## 2024-06-30 ENCOUNTER — Other Ambulatory Visit: Payer: Self-pay

## 2024-06-30 VITALS — BP 124/80 | HR 64 | Ht 64.0 in | Wt 156.0 lb

## 2024-06-30 DIAGNOSIS — M25552 Pain in left hip: Secondary | ICD-10-CM

## 2024-06-30 DIAGNOSIS — M2550 Pain in unspecified joint: Secondary | ICD-10-CM

## 2024-06-30 DIAGNOSIS — M25551 Pain in right hip: Secondary | ICD-10-CM

## 2024-06-30 LAB — VITAMIN B12: Vitamin B-12: 968 pg/mL — ABNORMAL HIGH (ref 211–911)

## 2024-06-30 LAB — COMPREHENSIVE METABOLIC PANEL WITH GFR
ALT: 16 U/L (ref 0–35)
AST: 21 U/L (ref 0–37)
Albumin: 4.5 g/dL (ref 3.5–5.2)
Alkaline Phosphatase: 56 U/L (ref 39–117)
BUN: 15 mg/dL (ref 6–23)
CO2: 30 meq/L (ref 19–32)
Calcium: 10.8 mg/dL — ABNORMAL HIGH (ref 8.4–10.5)
Chloride: 102 meq/L (ref 96–112)
Creatinine, Ser: 1.13 mg/dL (ref 0.40–1.20)
GFR: 53.2 mL/min — ABNORMAL LOW (ref >60.00)
Glucose, Bld: 80 mg/dL (ref 70–99)
Potassium: 4 meq/L (ref 3.5–5.1)
Sodium: 138 meq/L (ref 135–145)
Total Bilirubin: 0.5 mg/dL (ref 0.2–1.2)
Total Protein: 7 g/dL (ref 6.0–8.3)

## 2024-06-30 LAB — CBC WITH DIFFERENTIAL/PLATELET
Basophils Absolute: 0.1 K/uL (ref 0.0–0.1)
Basophils Relative: 2.3 % (ref 0.0–3.0)
Eosinophils Absolute: 0.1 K/uL (ref 0.0–0.7)
Eosinophils Relative: 2.3 % (ref 0.0–5.0)
HCT: 39.6 % (ref 36.0–46.0)
Hemoglobin: 13.3 g/dL (ref 12.0–15.0)
Lymphocytes Relative: 26.7 % (ref 12.0–46.0)
Lymphs Abs: 1.3 K/uL (ref 0.7–4.0)
MCHC: 33.4 g/dL (ref 30.0–36.0)
MCV: 86.6 fl (ref 78.0–100.0)
Monocytes Absolute: 0.3 K/uL (ref 0.1–1.0)
Monocytes Relative: 5.6 % (ref 3.0–12.0)
Neutro Abs: 3.1 K/uL (ref 1.4–7.7)
Neutrophils Relative %: 63.1 % (ref 43.0–77.0)
Platelets: 276 K/uL (ref 150.0–400.0)
RBC: 4.58 Mil/uL (ref 3.87–5.11)
RDW: 13.9 % (ref 11.5–15.5)
WBC: 4.9 K/uL (ref 4.0–10.5)

## 2024-06-30 NOTE — Patient Instructions (Addendum)
 Injected B GTs today Labs today  See me again in 4 months

## 2024-06-30 NOTE — Assessment & Plan Note (Signed)
 Bilateral injections given today.  Patient will be undergoing the removal of a ovarian mass.  This has not happened in 2 weeks.  We discussed with patient that I want him to be active afterwards when possible.  Will patient understands this and will not be in the pool for 8 weeks till afterwards.  We discussed that I would like to see her about 8 weeks after the surgery to see how she is feeling.  Discussed hip abductor strengthening exercises when patient does feel better.  Discussed icing regimen.  Follow-up again 2 to 3 months.

## 2024-07-01 NOTE — Patient Instructions (Addendum)
 SURGICAL WAITING ROOM VISITATION  Patients having surgery or a procedure may have no more than 2 support people in the waiting area - these visitors may rotate.    Children under the age of 38 must have an adult with them who is not the patient.  Visitors with respiratory illnesses are discouraged from visiting and should remain at home.  If the patient needs to stay at the hospital during part of their recovery, the visitor guidelines for inpatient rooms apply. Pre-op nurse will coordinate an appropriate time for 1 support person to accompany patient in pre-op.  This support person may not rotate.    Please refer to the [facility] website for the visitor guidelines for Inpatients (after your surgery is over and you are in a regular room).       Your procedure is scheduled on: 07-14-24    Report to [facility] Main Entrance    Report to admitting at       0845   AM   Call this number if you have problems the morning of surgery [phone]  Eat a light diet the day before surgery.  Examples including soups, broths, toast, yogurt, mashed potatoes.  Things to avoid include carbonated beverages (fizzy beverages), raw fruits and raw vegetables, or beans.   If your bowels are filled with gas, your surgeon will have difficulty visualizing your pelvic organs which increases your surgical risks.    Do not eat food :After Midnight.   After Midnight you may have the following liquids until _0800_____ AM/  DAY OF SURGERY   then nothing by mouth  Water Non-Citrus Juices (without pulp, NO RED-Apple, White grape, White cranberry) Black Coffee (NO MILK/CREAM OR CREAMERS, sugar ok)  Clear Tea (NO MILK/CREAM OR CREAMERS, sugar ok) regular and decaf                             Plain Jell-O (NO RED)                                           Fruit ices (not with fruit pulp, NO RED)                                     Popsicles (NO RED)                                                                Sports drinks like Gatorade (NO RED)                             If you have questions, please contact your surgeon's office.   FOLLOW  ANY ADDITIONAL PRE OP INSTRUCTIONS YOU RECEIVED FROM YOUR SURGEON'S OFFICE!!!     Oral Hygiene is also important to reduce your risk of infection.  Remember - BRUSH YOUR TEETH THE MORNING OF SURGERY WITH YOUR REGULAR TOOTHPASTE  DENTURES WILL BE REMOVED PRIOR TO SURGERY PLEASE DO NOT APPLY Poly grip OR ADHESIVES!!!   Do NOT smoke after Midnight   Stop all vitamins and herbal supplements 7 days before surgery.   Take these medicines the morning of surgery with A SIP OF WATER: pantoprazole , metoprolol , lamotrigine , lexapro                                You may not have any metal on your body including hair pins, jewelry, and body piercing             Do not wear make-up, lotions, powders, perfumes/cologne, or deodorant  Do not wear nail polish including gel and S&S, artificial/acrylic nails, or any other type of covering on natural nails including finger and toenails. If you have artificial nails, gel coating, etc. that needs to be removed by a nail salon please have this removed prior to surgery or surgery may need to be canceled/ delayed if the surgeon/ anesthesia feels like they are unable to be safely monitored.   Do not shave  48 hours prior to surgery.         Do not bring valuables to the hospital. [facility] IS NOT             RESPONSIBLE   FOR VALUABLES.   Contacts, glasses, dentures or bridgework may not be worn into surgery.   Bring small overnight bag day of surgery.   DO NOT BRING YOUR HOME MEDICATIONS TO THE HOSPITAL. PHARMACY WILL DISPENSE MEDICATIONS LISTED ON YOUR MEDICATION LIST TO YOU DURING YOUR ADMISSION IN THE HOSPITAL!    Patients discharged on the day of surgery will not be allowed to drive home.  Someone NEEDS to stay with you for the first 24 hours after  anesthesia.   Special Instructions: Bring a copy of your healthcare power of attorney and living will documents the day of surgery if you haven't scanned them before.              Please read over the following fact sheets you were given: IF YOU HAVE QUESTIONS ABOUT YOUR PRE-OP INSTRUCTIONS PLEASE CALL [phone].   If you test positive for Covid or have been in contact with anyone that has tested positive in the last 10 days please notify you surgeon.    [place] - Preparing for Surgery Before surgery, you can play an important role.  Because skin is not sterile, your skin needs to be as free of germs as possible.  You can reduce the number of germs on your skin by washing with CHG (chlorahexidine gluconate) soap before surgery.  CHG is an antiseptic cleaner which kills germs and bonds with the skin to continue killing germs even after washing. Please DO NOT use if you have an allergy to CHG or antibacterial soaps.  If your skin becomes reddened/irritated stop using the CHG and inform your nurse when you arrive at Short Stay. Do not shave (including legs and underarms) for at least 48 hours prior to the first CHG shower.  You may shave your face/neck. Please follow these instructions carefully:  1.  Shower with CHG Soap the night before surgery and the  morning of Surgery.  2.  If you choose to wash your hair, wash your hair first as usual with your  normal  shampoo.  3.  After you shampoo, rinse your hair and body thoroughly to remove the  shampoo.                           4.  Use CHG as you would any other liquid soap.  You can apply chg directly  to the skin and wash                       Gently with a scrungie or clean washcloth.  5.  Apply the CHG Soap to your body ONLY FROM THE NECK DOWN.   Do not use on face/ open                           Wound or open sores. Avoid contact with eyes, ears mouth and genitals (private parts).                       Wash face,  Genitals (private parts)  with your normal soap.             6.  Wash thoroughly, paying special attention to the area where your surgery  will be performed.  7.  Thoroughly rinse your body with warm water from the neck down.  8.  DO NOT shower/wash with your normal soap after using and rinsing off  the CHG Soap.                9.  Pat yourself dry with a clean towel.            10.  Wear clean pajamas.            11.  Place clean sheets on your bed the night of your first shower and do not  sleep with pets. Day of Surgery : Do not apply any lotions/deodorants the morning of surgery.  Please wear clean clothes to the hospital/surgery center.  FAILURE TO FOLLOW THESE INSTRUCTIONS MAY RESULT IN THE CANCELLATION OF YOUR SURGERY PATIENT SIGNATURE_________________________________  NURSE SIGNATURE__________________________________  ________________________________________________________________________ WHAT IS A BLOOD TRANSFUSION? Blood Transfusion Information  A transfusion is the replacement of blood or some of its parts. Blood is made up of multiple cells which provide different functions. Red blood cells carry oxygen and are used for blood loss replacement. White blood cells fight against infection. Platelets control bleeding. Plasma helps clot blood. Other blood products are available for specialized needs, such as hemophilia or other clotting disorders. BEFORE THE TRANSFUSION  Who gives blood for transfusions?  Healthy volunteers who are fully evaluated to make sure their blood is safe. This is blood bank blood. Transfusion therapy is the safest it has ever been in the practice of medicine. Before blood is taken from a donor, a complete history is taken to make sure that person has no history of diseases nor engages in risky social behavior (examples are intravenous drug use or sexual activity with multiple partners). The donor's travel history is screened to minimize risk of transmitting infections, such as  malaria. The donated blood is tested for signs of infectious diseases, such as HIV and hepatitis. The blood is then tested to be sure it is compatible with you in order to minimize the chance of a transfusion reaction. If you or a relative donates blood, this is often done in anticipation of surgery and is not appropriate for emergency situations. It takes many days  to process the donated blood. RISKS AND COMPLICATIONS Although transfusion therapy is very safe and saves many lives, the main dangers of transfusion include:  Getting an infectious disease. Developing a transfusion reaction. This is an allergic reaction to something in the blood you were given. Every precaution is taken to prevent this. The decision to have a blood transfusion has been considered carefully by your caregiver before blood is given. Blood is not given unless the benefits outweigh the risks. AFTER THE TRANSFUSION Right after receiving a blood transfusion, you will usually feel much better and more energetic. This is especially true if your red blood cells have gotten low (anemic). The transfusion raises the level of the red blood cells which carry oxygen, and this usually causes an energy increase. The nurse administering the transfusion will monitor you carefully for complications. HOME CARE INSTRUCTIONS  No special instructions are needed after a transfusion. You may find your energy is better. Speak with your caregiver about any limitations on activity for underlying diseases you may have. SEEK MEDICAL CARE IF:  Your condition is not improving after your transfusion. You develop redness or irritation at the intravenous (IV) site. SEEK IMMEDIATE MEDICAL CARE IF:  Any of the following symptoms occur over the next 12 hours: Shaking chills. You have a temperature by mouth above 102 F (38.9 C), not controlled by medicine. Chest, back, or muscle pain. People around you feel you are not acting correctly or are  confused. Shortness of breath or difficulty breathing. Dizziness and fainting. You get a rash or develop hives. You have a decrease in urine output. Your urine turns a dark color or changes to pink, red, or brown. Any of the following symptoms occur over the next 10 days: You have a temperature by mouth above 102 F (38.9 C), not controlled by medicine. Shortness of breath. Weakness after normal activity. The white part of the eye turns yellow (jaundice). You have a decrease in the amount of urine or are urinating less often. Your urine turns a dark color or changes to pink, red, or brown. Document Released: 11/23/2000 Document Revised: 02/18/2012 Document Reviewed: 07/12/2008 [facility] Patient Information 2014 [place], [place].  _______________________________________________________________________

## 2024-07-01 NOTE — Progress Notes (Addendum)
 PCP -  TU ,Alfrieda Chock , DO [facility]  Cardiologist - Redell Montenegro LOV 11-05-23 epic  PPM/ICD -  Device Orders -  Rep Notified -   Chest x-ray -  EKG - 09-24-23 CE media Stress Test -  ECHO - 10-17-23 CE Cardiac Cath -  CT cardiac-03-20-24 epic LABS-CBC/Diff , CMP  06-30-24 epic   Sleep Study -  CPAP - n/a  Fasting Blood Sugar -  Checks Blood Sugar __n/a___ times a day  Blood Thinner Instructions:n/a Aspirin Instructions:n/a  ERAS Protcol - PRE-SURGERY n/a-    COVID vaccine -yes  Activity--Able to climb a flight of stairs with no CP or SOB  Anesthesia review: ST, HTN ,Claustrophobia,anxiety  Patient denies shortness of breath, fever, cough and chest pain at PAT appointment   All instructions explained to the patient, with a verbal understanding of the material. Patient agrees to go over the instructions while at home for a better understanding. Patient also instructed to self quarantine after being tested for COVID-19. The opportunity to ask questions was provided.

## 2024-07-08 ENCOUNTER — Encounter (HOSPITAL_COMMUNITY): Payer: Self-pay

## 2024-07-08 ENCOUNTER — Encounter (HOSPITAL_COMMUNITY)
Admission: RE | Admit: 2024-07-08 | Discharge: 2024-07-08 | Disposition: A | Discharge status: Home / Self Care | Source: Ambulatory Visit | Attending: Psychiatry | Admitting: Psychiatry

## 2024-07-08 ENCOUNTER — Other Ambulatory Visit: Payer: Self-pay

## 2024-07-08 DIAGNOSIS — Z01812 Encounter for preprocedural laboratory examination: Secondary | ICD-10-CM | POA: Diagnosis not present

## 2024-07-08 DIAGNOSIS — N9489 Other specified conditions associated with female genital organs and menstrual cycle: Secondary | ICD-10-CM | POA: Insufficient documentation

## 2024-07-08 HISTORY — DX: Personal history of other diseases of the digestive system: Z87.19

## 2024-07-08 HISTORY — DX: Pneumonia, unspecified organism: J18.9

## 2024-07-08 HISTORY — DX: Anxiety disorder, unspecified: F41.9

## 2024-07-10 ENCOUNTER — Telehealth: Payer: Self-pay | Admitting: *Deleted

## 2024-07-10 ENCOUNTER — Other Ambulatory Visit: Payer: Self-pay | Admitting: Gynecologic Oncology

## 2024-07-10 NOTE — Telephone Encounter (Signed)
 Spoke with patient to ask if she has picked up her post op medication? Oxycodone  and Senokot-S for after surgery? Pt states she has picked them up. Advised patient that we wanted to make sure because the were advertently been removed off of her med list. Pt thanked the office for calling and she was reminded that the office would call on Monday for her pre-op call.

## 2024-07-13 ENCOUNTER — Telehealth: Payer: Self-pay | Admitting: *Deleted

## 2024-07-13 NOTE — Anesthesia Preprocedure Evaluation (Signed)
 Anesthesia Evaluation  Patient identified by MRN, date of birth, ID band Patient awake    Reviewed: Allergy & Precautions, NPO status , Patient's Chart, lab work & pertinent test results  Airway Mallampati: II  TM Distance: >3 FB Neck ROM: Full    Dental no notable dental hx. (+) Dental Advisory Given, Teeth Intact   Pulmonary pneumonia   Pulmonary exam normal breath sounds clear to auscultation       Cardiovascular hypertension, Pt. on home beta blockers Normal cardiovascular exam Rhythm:Regular Rate:Normal     Neuro/Psych  PSYCHIATRIC DISORDERS Anxiety  Bipolar Disorder   negative neurological ROS     GI/Hepatic Neg liver ROS, hiatal hernia,GERD  Medicated,,  Endo/Other  negative endocrine ROS    Renal/GU negative Renal ROS     Musculoskeletal negative musculoskeletal ROS (+)    Abdominal   Peds  Hematology negative hematology ROS (+)   Anesthesia Other Findings   Reproductive/Obstetrics                              Anesthesia Physical Anesthesia Plan  ASA: 3  Anesthesia Plan: General   Post-op Pain Management: Tylenol  PO (pre-op)* and Gabapentin  PO (pre-op)*   Induction: Intravenous  PONV Risk Score and Plan: 4 or greater and Ondansetron , Dexamethasone , Treatment may vary due to age or medical condition and Midazolam   Airway Management Planned: Oral ETT  Additional Equipment:   Intra-op Plan:   Post-operative Plan: Extubation in OR  Informed Consent: I have reviewed the patients History and Physical, chart, labs and discussed the procedure including the risks, benefits and alternatives for the proposed anesthesia with the patient or authorized representative who has indicated his/her understanding and acceptance.     Dental advisory given  Plan Discussed with: CRNA  Anesthesia Plan Comments: (2 x PIV)         Anesthesia Quick Evaluation

## 2024-07-13 NOTE — Telephone Encounter (Signed)
Telephone call to check on pre-operative status.  Patient compliant with pre-operative instructions.  Reinforced nothing to eat after midnight. Clear liquids until 0745. Patient to arrive at 0845.  No questions or concerns voiced.  Instructed to call for any needs.  °

## 2024-07-14 ENCOUNTER — Ambulatory Visit (HOSPITAL_BASED_OUTPATIENT_CLINIC_OR_DEPARTMENT_OTHER): Payer: Self-pay

## 2024-07-14 ENCOUNTER — Ambulatory Visit (HOSPITAL_COMMUNITY): Payer: Self-pay | Admitting: Medical

## 2024-07-14 ENCOUNTER — Other Ambulatory Visit: Payer: Self-pay

## 2024-07-14 ENCOUNTER — Encounter (HOSPITAL_COMMUNITY): Payer: Self-pay | Admitting: Psychiatry

## 2024-07-14 ENCOUNTER — Ambulatory Visit (HOSPITAL_COMMUNITY)
Admission: RE | Admit: 2024-07-14 | Discharge: 2024-07-14 | Disposition: A | Discharge status: Home / Self Care | Source: Ambulatory Visit | Attending: Psychiatry | Admitting: Psychiatry

## 2024-07-14 ENCOUNTER — Encounter (HOSPITAL_COMMUNITY)
Admission: RE | Disposition: A | Discharge status: Home / Self Care | Payer: Self-pay | Source: Ambulatory Visit | Attending: Psychiatry

## 2024-07-14 DIAGNOSIS — K219 Gastro-esophageal reflux disease without esophagitis: Secondary | ICD-10-CM | POA: Diagnosis not present

## 2024-07-14 DIAGNOSIS — E7800 Pure hypercholesterolemia, unspecified: Secondary | ICD-10-CM | POA: Diagnosis not present

## 2024-07-14 DIAGNOSIS — K449 Diaphragmatic hernia without obstruction or gangrene: Secondary | ICD-10-CM | POA: Diagnosis not present

## 2024-07-14 DIAGNOSIS — Z791 Long term (current) use of non-steroidal anti-inflammatories (NSAID): Secondary | ICD-10-CM | POA: Insufficient documentation

## 2024-07-14 DIAGNOSIS — F319 Bipolar disorder, unspecified: Secondary | ICD-10-CM | POA: Diagnosis not present

## 2024-07-14 DIAGNOSIS — I10 Essential (primary) hypertension: Secondary | ICD-10-CM

## 2024-07-14 DIAGNOSIS — N838 Other noninflammatory disorders of ovary, fallopian tube and broad ligament: Secondary | ICD-10-CM | POA: Diagnosis not present

## 2024-07-14 DIAGNOSIS — F418 Other specified anxiety disorders: Secondary | ICD-10-CM

## 2024-07-14 DIAGNOSIS — C562 Malignant neoplasm of left ovary: Secondary | ICD-10-CM

## 2024-07-14 DIAGNOSIS — N9489 Other specified conditions associated with female genital organs and menstrual cycle: Secondary | ICD-10-CM | POA: Diagnosis not present

## 2024-07-14 DIAGNOSIS — F419 Anxiety disorder, unspecified: Secondary | ICD-10-CM | POA: Insufficient documentation

## 2024-07-14 DIAGNOSIS — C5702 Malignant neoplasm of left fallopian tube: Secondary | ICD-10-CM | POA: Diagnosis not present

## 2024-07-14 DIAGNOSIS — Z79899 Other long term (current) drug therapy: Secondary | ICD-10-CM | POA: Diagnosis not present

## 2024-07-14 DIAGNOSIS — C7A098 Malignant carcinoid tumors of other sites: Secondary | ICD-10-CM | POA: Insufficient documentation

## 2024-07-14 DIAGNOSIS — K660 Peritoneal adhesions (postprocedural) (postinfection): Secondary | ICD-10-CM | POA: Diagnosis not present

## 2024-07-14 HISTORY — PX: ROBOTIC ASSISTED BILATERAL SALPINGO OOPHERECTOMY: SHX6078

## 2024-07-14 LAB — TYPE AND SCREEN
ABO/RH(D): O NEG
Antibody Screen: NEGATIVE

## 2024-07-14 LAB — ABO/RH: ABO/RH(D): O NEG

## 2024-07-14 SURGERY — SALPINGO-OOPHORECTOMY, BILATERAL, ROBOT-ASSISTED
Anesthesia: General | Site: Abdomen | Laterality: Bilateral

## 2024-07-14 MED ORDER — DEXAMETHASONE SODIUM PHOSPHATE 10 MG/ML IJ SOLN
4.0000 mg | INTRAMUSCULAR | Status: AC
  Administered 2024-07-14: 4 mg via INTRAVENOUS

## 2024-07-14 MED ORDER — LACTATED RINGERS IR SOLN
Status: DC | PRN
  Administered 2024-07-14: 1000 mL

## 2024-07-14 MED ORDER — HYDROMORPHONE HCL 2 MG/ML IJ SOLN
INTRAMUSCULAR | Status: AC
  Filled 2024-07-14: qty 1

## 2024-07-14 MED ORDER — PROPOFOL 1000 MG/100ML IV EMUL
INTRAVENOUS | Status: AC
  Filled 2024-07-14: qty 100

## 2024-07-14 MED ORDER — STERILE WATER FOR IRRIGATION IR SOLN
Status: DC | PRN
  Administered 2024-07-14: 1000 mL

## 2024-07-14 MED ORDER — LIDOCAINE HCL (PF) 2 % IJ SOLN
INTRAMUSCULAR | Status: AC
Start: 2024-07-14 — End: 2024-07-14
  Filled 2024-07-14: qty 5

## 2024-07-14 MED ORDER — BUPIVACAINE HCL (PF) 0.25 % IJ SOLN
INTRAMUSCULAR | Status: AC
  Filled 2024-07-14: qty 30

## 2024-07-14 MED ORDER — ACETAMINOPHEN 10 MG/ML IV SOLN
INTRAVENOUS | Status: AC
  Filled 2024-07-14: qty 100

## 2024-07-14 MED ORDER — LIDOCAINE HCL 2 % IJ SOLN
INTRAMUSCULAR | Status: AC
  Filled 2024-07-14: qty 20

## 2024-07-14 MED ORDER — CHLORHEXIDINE GLUCONATE 0.12 % MT SOLN
15.0000 mL | Freq: Once | OROMUCOSAL | Status: AC
  Administered 2024-07-14: 15 mL via OROMUCOSAL

## 2024-07-14 MED ORDER — DIPHENHYDRAMINE HCL 50 MG/ML IJ SOLN
INTRAMUSCULAR | Status: DC | PRN
  Administered 2024-07-14: 25 mg via INTRAVENOUS

## 2024-07-14 MED ORDER — ORAL CARE MOUTH RINSE: 15.0000 mL | Freq: Once | OROMUCOSAL | Status: AC

## 2024-07-14 MED ORDER — SUGAMMADEX SODIUM 200 MG/2ML IV SOLN
INTRAVENOUS | Status: AC
  Filled 2024-07-14: qty 2

## 2024-07-14 MED ORDER — FENTANYL CITRATE (PF) 100 MCG/2ML IJ SOLN
INTRAMUSCULAR | Status: AC
Start: 2024-07-14 — End: 2024-07-14
  Filled 2024-07-14: qty 2

## 2024-07-14 MED ORDER — LIDOCAINE HCL (PF) 2 % IJ SOLN
INTRAMUSCULAR | Status: AC
  Filled 2024-07-14: qty 5

## 2024-07-14 MED ORDER — DEXAMETHASONE SODIUM PHOSPHATE 10 MG/ML IJ SOLN
INTRAMUSCULAR | Status: AC
  Filled 2024-07-14: qty 1

## 2024-07-14 MED ORDER — ROCURONIUM BROMIDE 10 MG/ML (PF) SYRINGE
PREFILLED_SYRINGE | INTRAVENOUS | Status: AC
Start: 2024-07-14 — End: 2024-07-14
  Filled 2024-07-14: qty 10

## 2024-07-14 MED ORDER — LIDOCAINE HCL (PF) 2 % IJ SOLN
INTRAMUSCULAR | Status: DC | PRN
  Administered 2024-07-14: 1.5 mg/kg/h via INTRADERMAL

## 2024-07-14 MED ORDER — EPHEDRINE 5 MG/ML INJ
INTRAVENOUS | Status: AC
  Filled 2024-07-14: qty 5

## 2024-07-14 MED ORDER — DEXMEDETOMIDINE HCL IN NACL 80 MCG/20ML IV SOLN
INTRAVENOUS | Status: DC | PRN
Start: 2024-07-14 — End: 2024-07-14
  Administered 2024-07-14: 8 ug via INTRAVENOUS

## 2024-07-14 MED ORDER — LACTATED RINGERS IV SOLN
INTRAVENOUS | Status: DC | PRN
Start: 2024-07-14 — End: 2024-07-14

## 2024-07-14 MED ORDER — MIDAZOLAM HCL 5 MG/5ML IJ SOLN
INTRAMUSCULAR | Status: DC | PRN
  Administered 2024-07-14: 2 mg via INTRAVENOUS

## 2024-07-14 MED ORDER — BUPIVACAINE HCL 0.25 % IJ SOLN
INTRAMUSCULAR | Status: DC | PRN
  Administered 2024-07-14: 20 mL

## 2024-07-14 MED ORDER — ACETAMINOPHEN 500 MG PO TABS: 1000.0000 mg | ORAL_TABLET | ORAL | Status: DC

## 2024-07-14 MED ORDER — FENTANYL CITRATE (PF) 100 MCG/2ML IJ SOLN
INTRAMUSCULAR | Status: DC | PRN
  Administered 2024-07-14 (×4): 50 ug via INTRAVENOUS

## 2024-07-14 MED ORDER — MIDAZOLAM HCL 2 MG/2ML IJ SOLN
INTRAMUSCULAR | Status: AC
  Filled 2024-07-14: qty 2

## 2024-07-14 MED ORDER — LACTATED RINGERS IV SOLN: INTRAVENOUS | Status: DC

## 2024-07-14 MED ORDER — FENTANYL CITRATE (PF) 100 MCG/2ML IJ SOLN
INTRAMUSCULAR | Status: AC
  Filled 2024-07-14: qty 2

## 2024-07-14 MED ORDER — ONDANSETRON HCL 4 MG/2ML IJ SOLN
INTRAMUSCULAR | Status: DC | PRN
  Administered 2024-07-14: 4 mg via INTRAVENOUS

## 2024-07-14 MED ORDER — PROPOFOL 10 MG/ML IV BOLUS
INTRAVENOUS | Status: DC | PRN
  Administered 2024-07-14: 20 mg via INTRAVENOUS
  Administered 2024-07-14: 140 mg via INTRAVENOUS

## 2024-07-14 MED ORDER — LIDOCAINE HCL (CARDIAC) PF 100 MG/5ML IV SOSY
PREFILLED_SYRINGE | INTRAVENOUS | Status: DC | PRN
  Administered 2024-07-14: 70 mg via INTRAVENOUS

## 2024-07-14 MED ORDER — HYDROMORPHONE HCL 1 MG/ML IJ SOLN
INTRAMUSCULAR | Status: DC | PRN
  Administered 2024-07-14: 1 mg via INTRAVENOUS

## 2024-07-14 MED ORDER — PROPOFOL 10 MG/ML IV BOLUS
INTRAVENOUS | Status: AC
  Filled 2024-07-14: qty 20

## 2024-07-14 MED ORDER — SUGAMMADEX SODIUM 200 MG/2ML IV SOLN
INTRAVENOUS | Status: DC | PRN
  Administered 2024-07-14: 150 mg via INTRAVENOUS

## 2024-07-14 MED ORDER — ROCURONIUM BROMIDE 100 MG/10ML IV SOLN
INTRAVENOUS | Status: DC | PRN
Start: 2024-07-14 — End: 2024-07-14
  Administered 2024-07-14: 60 mg via INTRAVENOUS
  Administered 2024-07-14: 20 mg via INTRAVENOUS
  Administered 2024-07-14: 10 mg via INTRAVENOUS

## 2024-07-14 MED ORDER — HEPARIN SODIUM (PORCINE) 5000 UNIT/ML IJ SOLN: 5000.0000 [IU] | INTRAMUSCULAR | Status: DC

## 2024-07-14 MED ORDER — DIPHENHYDRAMINE HCL 50 MG/ML IJ SOLN
INTRAMUSCULAR | Status: AC
Start: 2024-07-14 — End: 2024-07-14
  Filled 2024-07-14: qty 1

## 2024-07-14 MED ORDER — ACETAMINOPHEN 10 MG/ML IV SOLN
INTRAVENOUS | Status: DC | PRN
Start: 2024-07-14 — End: 2024-07-14
  Administered 2024-07-14: 1000 mg via INTRAVENOUS

## 2024-07-14 MED ORDER — EPHEDRINE SULFATE-NACL 50-0.9 MG/10ML-% IV SOSY
PREFILLED_SYRINGE | INTRAVENOUS | Status: DC | PRN
  Administered 2024-07-14: 10 mg via INTRAVENOUS

## 2024-07-14 MED ORDER — PROPOFOL 500 MG/50ML IV EMUL
INTRAVENOUS | Status: DC | PRN
  Administered 2024-07-14: 85 ug/kg/min via INTRAVENOUS

## 2024-07-14 MED ORDER — KETAMINE HCL 50 MG/5ML IJ SOSY
PREFILLED_SYRINGE | INTRAMUSCULAR | Status: AC
  Filled 2024-07-14: qty 5

## 2024-07-14 MED ORDER — ONDANSETRON HCL 4 MG/2ML IJ SOLN
INTRAMUSCULAR | Status: AC
Start: 2024-07-14 — End: 2024-07-14
  Filled 2024-07-14: qty 2

## 2024-07-14 SURGICAL SUPPLY — 65 items
APPLICATOR SURGIFLO ENDO (HEMOSTASIS) IMPLANT
BAG LAPAROSCOPIC 12 15 PORT 16 (BASKET) IMPLANT
BLADE SURG SZ10 CARB STEEL (BLADE) IMPLANT
COVER BACK TABLE 60X90IN (DRAPES) ×2 IMPLANT
COVER TIP SHEARS 8 DVNC (MISCELLANEOUS) ×2 IMPLANT
DERMABOND ADVANCED .7 DNX12 (GAUZE/BANDAGES/DRESSINGS) ×2 IMPLANT
DRAPE ARM DVNC X/XI (DISPOSABLE) ×8 IMPLANT
DRAPE COLUMN DVNC XI (DISPOSABLE) ×2 IMPLANT
DRAPE SHEET LG 3/4 BI-LAMINATE (DRAPES) ×2 IMPLANT
DRAPE SURG IRRIG POUCH 19X23 (DRAPES) ×2 IMPLANT
DRIVER NDL MEGA SUTCUT DVNCXI (INSTRUMENTS) ×1 IMPLANT
DRIVER NDLE MEGA SUTCUT DVNCXI (INSTRUMENTS) ×2 IMPLANT
DRSG OPSITE POSTOP 4X6 (GAUZE/BANDAGES/DRESSINGS) IMPLANT
DRSG OPSITE POSTOP 4X8 (GAUZE/BANDAGES/DRESSINGS) IMPLANT
ELECT PENCIL ROCKER SW 15FT (MISCELLANEOUS) IMPLANT
ELECT REM PT RETURN 15FT ADLT (MISCELLANEOUS) ×2 IMPLANT
FORCEPS BPLR FENES DVNC XI (FORCEP) ×2 IMPLANT
FORCEPS PROGRASP DVNC XI (FORCEP) ×2 IMPLANT
GAUZE 4X4 16PLY ~~LOC~~+RFID DBL (SPONGE) ×2 IMPLANT
GLOVE BIO SURGEON STRL SZ 6.5 (GLOVE) ×2 IMPLANT
GLOVE BIOGEL PI IND STRL 6.5 (GLOVE) ×4 IMPLANT
GLOVE BIOGEL PI MICRO STRL 6 (GLOVE) ×8 IMPLANT
GOWN STRL REUS W/ TWL LRG LVL3 (GOWN DISPOSABLE) ×8 IMPLANT
GRASPER SUT TROCAR 14GX15 (MISCELLANEOUS) ×1 IMPLANT
HOLDER FOLEY CATH W/STRAP (MISCELLANEOUS) IMPLANT
IRRIGATION SUCT STRKRFLW 2 WTP (MISCELLANEOUS) ×2 IMPLANT
KIT PROCEDURE DVNC SI (MISCELLANEOUS) IMPLANT
KIT TURNOVER KIT A (KITS) ×2 IMPLANT
LIGASURE IMPACT 36 18CM CVD LR (INSTRUMENTS) IMPLANT
MANIPULATOR ADVINCU DEL 3.0 PL (MISCELLANEOUS) IMPLANT
MANIPULATOR ADVINCU DEL 3.5 PL (MISCELLANEOUS) IMPLANT
MANIPULATOR UTERINE 4.5 ZUMI (MISCELLANEOUS) IMPLANT
NDL HYPO 21X1.5 SAFETY (NEEDLE) ×1 IMPLANT
NDL INSUFFLATION 14GA 120MM (NEEDLE) IMPLANT
NDL SPNL 20GX3.5 QUINCKE YW (NEEDLE) IMPLANT
NEEDLE HYPO 21X1.5 SAFETY (NEEDLE) ×2 IMPLANT
NEEDLE INSUFFLATION 14GA 120MM (NEEDLE) IMPLANT
NEEDLE SPNL 20GX3.5 QUINCKE YW (NEEDLE) IMPLANT
OBTURATOR OPTICALSTD 8 DVNC (TROCAR) ×2 IMPLANT
PACK ROBOT GYN CUSTOM WL (TRAY / TRAY PROCEDURE) ×2 IMPLANT
PAD ARMBOARD POSITIONER FOAM (MISCELLANEOUS) ×2 IMPLANT
PAD POSITIONING PINK XL (MISCELLANEOUS) ×2 IMPLANT
PORT ACCESS TROCAR AIRSEAL 12 (TROCAR) ×1 IMPLANT
SCISSORS MNPLR CVD DVNC XI (INSTRUMENTS) ×2 IMPLANT
SCRUB CHG 4% DYNA-HEX 4OZ (MISCELLANEOUS) ×4 IMPLANT
SEAL UNIV 5-12 XI (MISCELLANEOUS) ×8 IMPLANT
SET TRI-LUMEN FLTR TB AIRSEAL (TUBING) ×2 IMPLANT
SPIKE FLUID TRANSFER (MISCELLANEOUS) ×2 IMPLANT
SPONGE T-LAP 18X18 ~~LOC~~+RFID (SPONGE) IMPLANT
SURGIFLO W/THROMBIN 8M KIT (HEMOSTASIS) IMPLANT
SUT MNCRL AB 4-0 PS2 18 (SUTURE) IMPLANT
SUT PDS AB 1 TP1 54 (SUTURE) IMPLANT
SUT VIC AB 0 CT1 27XBRD ANTBC (SUTURE) IMPLANT
SUT VIC AB 2-0 CT1 TAPERPNT 27 (SUTURE) IMPLANT
SUT VIC AB 4-0 PS2 18 (SUTURE) ×4 IMPLANT
SUT VICRYL 0 27 CT2 27 ABS (SUTURE) ×2 IMPLANT
SYR 10ML LL (SYRINGE) IMPLANT
SYSTEM BAG RETRIEVAL 10MM (BASKET) IMPLANT
SYSTEM WOUND ALEXIS 18CM MED (MISCELLANEOUS) IMPLANT
TRAP SPECIMEN MUCUS 40CC (MISCELLANEOUS) IMPLANT
TRAY FOLEY MTR SLVR 16FR STAT (SET/KITS/TRAYS/PACK) ×2 IMPLANT
TROCAR PORT AIRSEAL 5X120 (TROCAR) IMPLANT
UNDERPAD 30X36 HEAVY ABSORB (UNDERPADS AND DIAPERS) ×4 IMPLANT
WATER STERILE IRR 1000ML POUR (IV SOLUTION) ×2 IMPLANT
YANKAUER SUCT BULB TIP 10FT TU (MISCELLANEOUS) IMPLANT

## 2024-07-14 NOTE — Discharge Instructions (Addendum)
 AFTER SURGERY INSTRUCTIONS   Return to work: 4-6 weeks if applicable  Today Dr. Eldonna removed the left tube and ovary and the right fallopian tube.    Activity: 1. Be up and out of the bed during the day.  Take a nap if needed.  You may walk up steps but be careful and use the hand rail.  Stair climbing will tire you more than you think, you may need to stop part way and rest.    2. No lifting or straining for 6 weeks over 10 pounds. No pushing, pulling, straining for 6 weeks.   3. No driving for 4-89 days when the following criteria have been met: Do not drive if you are taking narcotic pain medicine and make sure that your reaction time has returned.    4. You can shower as soon as the next day after surgery. Shower daily.  Use your regular soap and water  (not directly on the incision) and pat your incision(s) dry afterwards; don't rub.  No tub baths or submerging your body in water  until cleared by your surgeon. If you have the soap that was given to you by pre-surgical testing that was used before surgery, you do not need to use it afterwards because this can irritate your incisions.    5. No sexual activity and nothing in the vagina for 6 weeks.   6. You may experience a small amount of clear drainage from your incisions, which is normal.  If the drainage persists, increases, or changes color please call the office.   7. Do not use creams, lotions, or ointments such as neosporin on your incisions after surgery until advised by your surgeon because they can cause removal of the dermabond glue on your incisions.     8. You may experience vaginal spotting after surgery.  The spotting is normal but if you experience heavy bleeding, call our office.   9. Take Tylenol  or ibuprofen (OR MOBIC , DO NOT TAKE TOGETHER) first for pain if you are able to take these medications and only use narcotic pain medication for severe pain not relieved by the Tylenol  or Ibuprofen.  Monitor your Tylenol  intake  to a max of 4,000 mg in a 24 hour period. You can alternate these medications after surgery.   Diet: 1. Low sodium Heart Healthy Diet is recommended but you are cleared to resume your normal (before surgery) diet after your procedure.   2. It is safe to use a laxative, such as Miralax or Colace, if you have difficulty moving your bowels before surgery. You have been prescribed Sennakot-S to take at bedtime every evening after surgery to keep bowel movements regular and to prevent constipation.     Wound Care: 1. Keep clean and dry.  Shower daily.   Reasons to call the Doctor: Fever - Oral temperature greater than 100.4 degrees Fahrenheit Foul-smelling vaginal discharge Difficulty urinating Nausea and vomiting Increased pain at the site of the incision that is unrelieved with pain medicine. Difficulty breathing with or without chest pain New calf pain especially if only on one side Sudden, continuing increased vaginal bleeding with or without clots.   Contacts: For questions or concerns you should contact:   Dr. Hoy Eldonna at [phone]   Eleanor Epps, NP at [phone]   After Hours: call [phone] and have the GYN Oncologist paged/contacted (after 5 pm or on the weekends). You will speak with an after hours RN and let he or she know you have had surgery.  Messages sent via mychart are for non-urgent matters and are not responded to after hours so for urgent needs, please call the after hours number.

## 2024-07-14 NOTE — Interval H&P Note (Signed)
 History and Physical Interval Note:  07/14/2024 10:36 AM  Catherine Fry  has presented today for surgery, with the diagnosis of Adnexal mass.  The various methods of treatment have been discussed with the patient and family. After consideration of risks, benefits and other options for treatment, the patient has consented to  Procedure(s): SALPINGO-OOPHORECTOMY, BILATERAL, ROBOT-ASSISTED (Bilateral) HYSTERECTOMY, TOTAL, ROBOT-ASSISTED (N/A) as a surgical intervention.  The patient's history has been reviewed, patient examined, no change in status, stable for surgery.  I have reviewed the patient's chart and labs.  Questions were answered to the patient's satisfaction.     [name]

## 2024-07-14 NOTE — Anesthesia Procedure Notes (Signed)
 Procedure Name: Intubation Date/Time: 07/14/2024 11:32 AM  Performed by: Landy Chip HERO, CRNAPre-anesthesia Checklist: Patient identified, Emergency Drugs available, Suction available and Patient being monitored Patient Re-evaluated:Patient Re-evaluated prior to induction Oxygen Delivery Method: Circle System Utilized Preoxygenation: Pre-oxygenation with 100% oxygen Induction Type: IV induction Ventilation: Mask ventilation without difficulty Laryngoscope Size: Mac and 4 Grade View: Grade II Tube type: Oral Tube size: 7.0 mm Number of attempts: 1 Placement Confirmation: ETT inserted through vocal cords under direct vision, positive ETCO2 and breath sounds checked- equal and bilateral Secured at: 21 cm Tube secured with: Tape Dental Injury: Teeth and Oropharynx as per pre-operative assessment

## 2024-07-14 NOTE — Op Note (Addendum)
 GYNECOLOGIC ONCOLOGY OPERATIVE NOTE  Date of Service: 07/14/2024  Preoperative Diagnosis: Complex adnexal mass  Postoperative Diagnosis: Complex left ovarian mass  Procedures: Robotic assisted left salpingo-oophorectomy, right salpingectomy  Surgeon: Hoy Masters, MD  Assistants: Eleanor Epps, NP  Anesthesia: General  Estimated Blood Loss: 15 mL    Fluids: 200 ml, crystalloid  Urine Output: 850 ml, clear yellow  Findings: On entry to abdomen, normal upper abdominal survey with smooth diaphragm, liver, stomach and normal appearing omentum and bowel. Normal small uterus. Physiologic adhesions of the sigmoid colon to the left pelvic sidewall. Complex left ovarian mass. Normal left fallopian tube. Normal right fallopian tube. Right ovary surgically absent. 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 woman with a complex adnexal mass.  Prior to the procedure, all risks, benefits, and alternatives were discussed and informed surgical consent was signed.  Procedure: Patient was taken to the operating room where general anesthesia was achieved.  She was positioned in dorsal lithotomy and prepped and draped.  A foley catheter was inserted into the bladder.  The cervix was dilated and a hulka manipulator was secured in the cervix.    A 12 mm incision was made in the left upper quadrant near Palmer's point.  The abdomen was entered with a 5 mm OptiView trocar under direct visualization.  The abdomen  was insufflated, the patient placed in steep Trendelenburg, and additional trocars were placed as follows: an 8mm robotic trocar superior to the umbilicus, one 8 mm robotic trocar in the right abdomen, and one 8 mm robotic trocar in the left abdomen.  The left upper quadrant trocar was removed and replaced with a 12 mm airseal trocar.  All trocars were placed under direct visualization.  The bowels were moved into the upper abdomen.  The DaVinci robotic surgical system was brought to the patient's bedside and docked.  Pelvic washings were obtained. Filmy adhesions of the sigmoid colon to the left pelvic sidewall were lysed sharply with scissors. The peritoneum overlying the left pelvic sidewall was incised and the the left retroperitoneum entered.   The left ureter was identified.  The left infundibulopelvic ligament was isolated, cauterized, and transected.  The broad ligament was incised to the uterine cornu.  The utero-ovarian ligament and the proximal fallopian tube were isolated, cauterized, and transected.  The left adnexa was placed in the posterior cul-de-sac for later removal.  On the right, the fallopian tube was grasped and elevated. The mesosalpinx was serially cauterized and transected. At the cornua, the proximal fallopian tube was cauterized and transected. The fallopian tube was removed through the 12mm trocar. An endocatch bag was then introduced and the left adnexa was placed in the bag.The airseal trocar was removed and the bag was brought up through the left upper quadrant incision. The cyst was drained in the bag and the specimen was removed and sent for frozen pathology.   The pelvis was irrigated and all operative sites were found to be hemostatic.  All instruments were removed and the robot was taken from the patient's bedside. The fascia at the 12 mm incision was closed with 0 Vicryl with the assistance of a PMI device. The abdomen was desufflated and all ports were removed. The skin  at all incisions was closed with 4-0 Vicryl to reapproximate the subcutaneous tissue and 4-0 monocryl in a subcuticular fashion followed by surgical glue.  Patient tolerated the procedure well. Sponge, lap, and instrument counts were correct.  No perioperative antibiotic prophylaxis was indicated for this procedure.  She was extubated and taken to the PACU in stable condition.  Hoy Masters, MD Gynecologic Oncology

## 2024-07-14 NOTE — Transfer of Care (Signed)
 Immediate Anesthesia Transfer of Care Note  Patient: Catherine Fry  Procedure(s) Performed: ROBOTIC ASSISTED LEFT SALPINGO-OOPHORECTOMY, RIGHT SALPINGECTOMY (Bilateral: Abdomen)  Patient Location: PACU  Anesthesia Type:General  Level of Consciousness: drowsy  Airway & Oxygen Therapy: Patient Spontanous Breathing and Patient connected to face mask oxygen  Post-op Assessment: Report given to RN and Post -op Vital signs reviewed and stable  Post vital signs: Reviewed and stable  Last Vitals:  Vitals Value Taken Time  BP 145/69 07/14/24 13:52  Temp    Pulse 53 07/14/24 13:56  Resp 8 07/14/24 13:56  SpO2 100 % 07/14/24 13:56  Vitals shown include unfiled device data.  Last Pain:  Vitals:   07/14/24 1004  TempSrc:   PainSc: 0-No pain         Complications: No notable events documented.

## 2024-07-15 ENCOUNTER — Telehealth: Payer: Self-pay | Admitting: *Deleted

## 2024-07-15 ENCOUNTER — Encounter (HOSPITAL_COMMUNITY): Payer: Self-pay | Admitting: Psychiatry

## 2024-07-15 ENCOUNTER — Telehealth: Payer: Self-pay | Admitting: Oncology

## 2024-07-15 NOTE — Telephone Encounter (Signed)
 Catherine Fry and rescheduled her post op appointment with Dr. Eldonna to 07/27/24 at 3:00.

## 2024-07-15 NOTE — Telephone Encounter (Addendum)
 Spoke with Catherine Fry this morning. She states she is eating, drinking and urinating better than last night. Patient states the burning pain was a 10/10 last night. The lidocaine  didn't help but the azo has. Pt is still seeing a light pink blood when she urinates, but it's improving.  Pt is using a peri bottle and advised to keep using it while urinating. Reinforced to patient this could be from the foley catheter that she had while in the OR, and possibly from the soap prep as well. Advised if pt has no improvement within the next 24-48 hours to call the office.   She has not had a BM yet but is passing gas. She is taking dulcolax as she prefers this over the senokot. Encouraged her to drink plenty of water . She denies fever or chills. Incisions are dry and intact. She rates her abdominal incision  pain 3/10. Her pain is controlled with oxycodone . .     Instructed to call office with any fever, chills, purulent drainage, uncontrolled pain or any other questions or concerns. Patient verbalizes understanding.   Pt aware of post op appointments as well as the office number [phone] and after hours number [phone] to call if she has any questions or concerns

## 2024-07-16 ENCOUNTER — Telehealth: Payer: Self-pay

## 2024-07-16 LAB — SURGICAL PATHOLOGY

## 2024-07-16 NOTE — Telephone Encounter (Signed)
 S/P  Robotic assisted left salpingo-oophorectomy, right salpingectomy 07/14/24 with Dr.Newton.  Catherine Fry called stating she spoke to [facility] yesterday regarding having burning and pain upon urination since her surgery on Tuesday.   She took AZO with a little help but is still having pain, pressure and vaginal irritation. No bleeding, No fever/chills, eating/drinking fine.   She is aware she may need to drop off a urine sample for a urinalysis. She is asking for advice or something to be called in because she would rather not come out of the house if at all possible.   Pt aware message will be sent to Dr.Newton, and office will call her back with advice.

## 2024-07-16 NOTE — Anesthesia Postprocedure Evaluation (Signed)
 Anesthesia Post Note  Patient: Catherine Fry  Procedure(s) Performed: ROBOTIC ASSISTED LEFT SALPINGO-OOPHORECTOMY, RIGHT SALPINGECTOMY (Bilateral: Abdomen)     Patient location during evaluation: PACU Anesthesia Type: General Level of consciousness: sedated and patient cooperative Pain management: pain level controlled Vital Signs Assessment: post-procedure vital signs reviewed and stable Respiratory status: spontaneous breathing Cardiovascular status: stable Anesthetic complications: no   No notable events documented.  Last Vitals:  Vitals:   07/14/24 1430 07/14/24 1505  BP: (!) 144/62 132/70  Pulse: (!) 56 (!) 50  Resp: (!) 9 20  Temp:  (!) 36.1 C  SpO2: 98% 98%    Last Pain:  Vitals:   07/14/24 1505  TempSrc:   PainSc: 0-No pain                 Norleen Pope

## 2024-07-16 NOTE — Telephone Encounter (Signed)
 Per Catherine Fry and her conversation with [name] from yesterday. Pt states she can not tolerate Benadryl , it makes her to drowsy.

## 2024-07-17 LAB — CYTOLOGY - NON PAP

## 2024-07-17 NOTE — Telephone Encounter (Addendum)
 Spoke with Catherine Fry today. Pt states the burning on urination continues to improve and it's only just a little.   Pt reports using AZO that has helped with the burning, but she is now afraid she may have a yeast infection  because she looked it up and AZO can cause yeast?  Pt states she is no longer having vaginal burning, but reports she is having bright red bleeding, spotting only and is wearing a maxi-pad but only changing a couple times a day for sanitary reasons only.  Pt denies heavy bleeding without clots. She is trying to follow all activity restrictions and denies putting anything in the vagina. She is passing gas and has had a bowel movement that is loose because she has Chronic IBS. Patient is not straining.  Patient states she is experiencing vaginal itching and also white discharge on her underpants?  Pt denies fever, chills, flank pain and soreness only at her incision sites. She is only taking oxycodone  twice daily and trying to alternate with tylenol .   Pt also states that she was told by her provider that prescribes her klonopin  not to take it with benadryl . Advised patient as long as she is not taking the klonopin  together with the benadryl , it is ok to space them out. Pt only takes her klonopin  at night time. And only as needed, like prior to her surgery.   Advised patient to monitor the bleeding and call the office and or after hours number if it increases with or without blood clots and accompanies any new symptoms. Also advised patient if the burning on urination doesn't continue to improve with or without new symptoms to call the office and we can obtain a urine sample for urinalysis and possible culture. And message will be forwarded to provider.   Catherine Fry verbalized understanding and thanked the office for calling.

## 2024-07-20 ENCOUNTER — Telehealth: Payer: Self-pay | Admitting: Psychiatry

## 2024-07-20 ENCOUNTER — Ambulatory Visit: Payer: Self-pay | Admitting: Psychiatry

## 2024-07-20 DIAGNOSIS — D3A098 Benign carcinoid tumors of other sites: Secondary | ICD-10-CM

## 2024-07-20 NOTE — Telephone Encounter (Signed)
 Reports vaginal symptoms gradually improving.  Reviewed pathology results. Will plan for Dotatate PET.

## 2024-07-23 ENCOUNTER — Other Ambulatory Visit: Payer: Self-pay

## 2024-07-23 ENCOUNTER — Ambulatory Visit
Admission: EM | Admit: 2024-07-23 | Discharge: 2024-07-23 | Disposition: A | Discharge status: Home / Self Care | Attending: Family Medicine | Admitting: Family Medicine

## 2024-07-23 ENCOUNTER — Other Ambulatory Visit: Payer: Self-pay | Admitting: Gynecologic Oncology

## 2024-07-23 ENCOUNTER — Telehealth: Payer: Self-pay | Admitting: Surgery

## 2024-07-23 DIAGNOSIS — R300 Dysuria: Secondary | ICD-10-CM

## 2024-07-23 LAB — POCT URINALYSIS DIP (MANUAL ENTRY)
Bilirubin, UA: NEGATIVE
Blood, UA: NEGATIVE
Glucose, UA: NEGATIVE mg/dL
Ketones, POC UA: NEGATIVE mg/dL
Leukocytes, UA: NEGATIVE
Nitrite, UA: NEGATIVE
Protein Ur, POC: NEGATIVE mg/dL
Spec Grav, UA: 1.01 (ref 1.010–1.025)
Urobilinogen, UA: 0.2 U/dL
pH, UA: 5.5 (ref 5.0–8.0)

## 2024-07-23 NOTE — ED Triage Notes (Signed)
 Pt presents to [facility] with co removal of her left ovary one week ago. Pt has had recent urinary frequency and dysuria for about 4 days. Pt is concerned for a UTI

## 2024-07-23 NOTE — ED Provider Notes (Signed)
 [facility]    CSN: [id number] Arrival date & time: 07/23/24  1831      History   Chief Complaint Chief Complaint  Patient presents with   Dysuria   Urinary Frequency    HPI Catherine Fry is a 58 y.o. female.   HPI 58 year old female presents with dysuria, urinary frequency x 4 days.  Patient reports removal of left ovary 1 week ago.  PMH significant for HTN, IBS, and bipolar depression.  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   Essential hypertension 12/25/2016    Past Surgical History:  Procedure Laterality Date   BREAST CYST EXCISION Right 2025   benign   ROBOTIC ASSISTED BILATERAL SALPINGO OOPHERECTOMY Bilateral 07/14/2024   Procedure: ROBOTIC ASSISTED LEFT SALPINGO-OOPHORECTOMY, RIGHT SALPINGECTOMY;  Surgeon: Eldonna Mays, MD;  Location: [facility];  Service: Gynecology;  Laterality: Bilateral;   SALPINGOOPHORECTOMY Right    ovarian teratoma, pfannenstiel   TONSILLECTOMY      OB History     Gravida  0   Para  0   Term  0   Preterm  0   AB  0   Living  0      SAB  0   IAB  0   Ectopic  0   Multiple  0   Live Births  0            Home Medications    Prior to Admission medications   Medication Sig Start Date End Date Taking? Authorizing Provider  clonazePAM  (KLONOPIN ) 2 MG tablet Take 2 mg by mouth at bedtime.    [provider]  Cyanocobalamin  (VITAMIN B12)  1000 MCG TABS Take 1,000 mcg by mouth daily.    [provider]  escitalopram (LEXAPRO) 10 MG tablet Take 10 mg by mouth daily.    [provider]  lamoTRIgine  (LAMICTAL ) 200 MG tablet Take 200 mg by mouth daily.    [provider]  metoprolol  tartrate (LOPRESSOR ) 25 MG tablet Take 25 mg by mouth 2 (two) times daily.    [provider]  oxyCODONE  (OXY IR/ROXICODONE ) 5 MG immediate release tablet Take 5 mg by mouth every 4 (four) hours as needed for severe pain (pain score 7-10) (for post operative pain only - to start on evening of 07/14/24 after surgery.).    [provider]  pantoprazole  (PROTONIX ) 40 MG tablet Take 40 mg by mouth daily.    [provider]  QUEtiapine  (SEROQUEL ) 200 MG tablet Take 200 mg by mouth at bedtime.    [provider]  senna-docusate (SENOKOT-S) 8.6-50 MG tablet Take 2 tablets by mouth every evening. To start the evening of 8/5 after surgery. With 8 oz of water     [provider]    Family History Family History  Problem Relation Age  of Onset   Breast cancer Mother    Lymphoma Father    Alzheimer's disease Father    Ovarian cancer Neg Hx    Colon cancer Neg Hx    Endometrial cancer Neg Hx     Social History Social History   Tobacco Use   Smoking status: Never   Smokeless tobacco: Never  Vaping Use   Vaping status: Never Used  Substance Use Topics   Alcohol use: Yes    Comment: occasionally   Drug use: No     Allergies   Minocycline    Review of Systems Review of Systems  Genitourinary:  Positive for dysuria and frequency.  All other systems reviewed and are negative.    Physical Exam Triage Vital Signs ED Triage Vitals  Encounter Vitals Group     BP      Girls Systolic BP Percentile      Girls Diastolic BP Percentile      Boys Systolic BP Percentile      Boys Diastolic BP Percentile      Pulse      Resp      Temp      Temp src      SpO2      Weight      Height       Head Circumference      Peak Flow      Pain Score      Pain Loc      Pain Education      Exclude from Growth Chart    No data found.  Updated Vital Signs BP (!) 155/82   Pulse 70   Temp 98.6 F (37 C)   Resp 19   SpO2 98%   Visual Acuity Right Eye Distance:   Left Eye Distance:   Bilateral Distance:    Right Eye Near:   Left Eye Near:    Bilateral Near:     Physical Exam Vitals and nursing note reviewed.  Constitutional:      Appearance: Normal appearance. She is normal weight.  HENT:     Head: Normocephalic and atraumatic.     Mouth/Throat:     Mouth: Mucous membranes are moist.     Pharynx: Oropharynx is clear.  Eyes:     Extraocular Movements: Extraocular movements intact.     Conjunctiva/sclera: Conjunctivae normal.     Pupils: Pupils are equal, round, and reactive to light.  Cardiovascular:     Rate and Rhythm: Normal rate and regular rhythm.     Pulses: Normal pulses.     Heart sounds: Normal heart sounds.  Pulmonary:     Effort: Pulmonary effort is normal.     Breath sounds: Normal breath sounds. No wheezing, rhonchi or rales.  Musculoskeletal:        General: Normal range of motion.  Skin:    General: Skin is warm and dry.  Neurological:     General: No focal deficit present.     Mental Status: She is alert and oriented to person, place, and time. Mental status is at baseline.  Psychiatric:        Mood and Affect: Mood normal.        Behavior: Behavior normal.        Thought Content: Thought content normal.      UC Treatments / Results  Labs (all labs ordered are listed, but only abnormal results are displayed) Labs Reviewed  POCT URINALYSIS DIP (MANUAL ENTRY)    EKG  Radiology No results found.  Procedures Procedures (including critical care time)  Medications Ordered in UC Medications - No data to display  Initial Impression / Assessment and Plan / UC Course  I have reviewed the triage vital signs and the nursing  notes.  Pertinent labs & imaging results that were available during my care of the patient were reviewed by me and considered in my medical decision making (see chart for details).     MDM: 1.  Dysuria-UA revealed above, patient advised. Advised patient UA was completely unremarkable encouraged patient to increase daily water  intake to 64 ounces per day 7 days/week.  Advised if symptoms worsen and/or unresolved please follow-up with your PCP, urology or here for further evaluation.  Patient discharged home, hemodynamically stable Final Clinical Impressions(s) / UC Diagnoses   Final diagnoses:  Dysuria     Discharge Instructions      Advised patient UA was completely unremarkable encouraged patient to increase daily water  intake to 64 ounces per day 7 days/week.  Advised if symptoms worsen and/or unresolved please follow-up with your PCP, urology or here for further evaluation.     ED Prescriptions   None    PDMP not reviewed this encounter.   Teddy Sharper, FNP 07/23/24 1929

## 2024-07-23 NOTE — Telephone Encounter (Signed)
 Patient called in stating she is still having some burning with urination and frequency, and wanted to know if an order for a urinalysis can be added and done prior to her appointment with Dr Eldonna on Monday. Patient declined to come in earlier to give urine sample. Advised patient that we can order a UA and culture to be done Monday 8/18 but that if her symptoms worsen or if fever develops to go to urgent care for urine test so antibiotics can be stated sooner if that is needed. Patient verbalized understanding and stated that if her symptoms worsened prior to her appointment Monday she would go to urgent care. Order placed for labs and lab appointment made for patient on 8/18 at 2:15pm. Patient aware of date and time of lab appointment.

## 2024-07-23 NOTE — Discharge Instructions (Addendum)
 Advised patient UA was completely unremarkable encouraged patient to increase daily water  intake to 64 ounces per day 7 days/week.  Advised if symptoms worsen and/or unresolved please follow-up with your PCP, urology or here for further evaluation.

## 2024-07-27 ENCOUNTER — Inpatient Hospital Stay

## 2024-07-27 ENCOUNTER — Encounter: Payer: Self-pay | Admitting: Psychiatry

## 2024-07-27 ENCOUNTER — Inpatient Hospital Stay: Attending: Psychiatry | Admitting: Psychiatry

## 2024-07-27 VITALS — BP 128/72 | HR 67 | Temp 98.1°F | Resp 19 | Wt 155.4 lb

## 2024-07-27 DIAGNOSIS — C7A098 Malignant carcinoid tumors of other sites: Secondary | ICD-10-CM | POA: Diagnosis not present

## 2024-07-27 DIAGNOSIS — N9489 Other specified conditions associated with female genital organs and menstrual cycle: Secondary | ICD-10-CM

## 2024-07-27 DIAGNOSIS — Z90721 Acquired absence of ovaries, unilateral: Secondary | ICD-10-CM | POA: Diagnosis not present

## 2024-07-27 DIAGNOSIS — Z7189 Other specified counseling: Secondary | ICD-10-CM

## 2024-07-27 NOTE — Patient Instructions (Signed)
 It was a pleasure to see you in clinic today. - healing well - Okay to reintroduce normal activities - Return visit planned for 6months  Thank you very much for allowing me to provide care for you today.  I appreciate your confidence in choosing our Gynecologic Oncology team at [facility].  If you have any questions about your visit today please call our office or send us  a MyChart message and we will get back to you as soon as possible.

## 2024-07-27 NOTE — Progress Notes (Signed)
 Gynecologic Oncology Return Clinic Visit  Date of Service: 07/27/2024 Referring Provider: Perri Bjork, PA-C [address], [address] [place],  [place] [address]  Assessment & Plan: Catherine Fry is a 58 y.o. 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  Postop: - Pt recovering well from surgery and healing appropriately postoperatively - Ongoing postoperative expectations and precautions reviewed.  - Pt retired - Given that uterus is in situ, pt advised that she should continue with pap smear screening per routine until age 13 if she continues with negative/low grade paps.   Ovarian strumal carcinoid tumor: - Intraoperative findings and pathology results reviewed. - Reviewed that this is typically a low grade malignancy. Strumal carcinoids are not typically associated with metastases. - Recommend Dotatate PET to rule out any other sites of disease. - If negative PET, most carcinoid tumors are cured by surgery alone in early stage. - If PET negative, reviewed that in retrospective data in carcinoids of the ovary arising from dermoids, hysterectomy does not appear to affect outcome, so do not necessarily need to return to OR for hysterectomy - Recommend q16mo evaluation initially for surveillance. Can plan future imaging based on exam/symptom findings.   - Signs/symptoms of recurrence reviewed.  RTC 6 months.  Hoy Masters, MD Gynecologic Oncology   Medical Decision Making I personally spent  TOTAL 30 minutes face-to-face and non-face-to-face in the care of this patient, which includes all pre, intra, and post visit time on the date of service. The discussion of ovarian cancer is beyond the scope of routine postoperative care.   ----------------------- Reason for Visit: Postop/Treatment discussion  Treatment History: Oncology History  Malignant carcinoid tumor of ovary (HCC)  06/17/2024 Initial Diagnosis    Malignant carcinoid tumor of ovary (HCC)   07/14/2024 Cancer Staging   Staging form: Ovary, Fallopian Tube, and Primary Peritoneal Carcinoma, AJCC 8th Edition - Clinical stage from 07/14/2024: FIGO Stage IA, calculated as Stage Unknown (cT1a, cNX, cM0) - Signed by Masters Hoy, MD on 07/27/2024 Histopathologic type: Carcinoid tumor, NOS Stage prefix: Initial diagnosis   07/14/2024 Surgery   Procedures: Robotic assisted left salpingo-oophorectomy, right salpingectomy   Findings: On entry to abdomen, normal upper abdominal survey with smooth diaphragm, liver, stomach and normal appearing omentum and bowel. Normal small uterus. Physiologic adhesions of the sigmoid colon to the left pelvic sidewall. Complex left ovarian mass. Normal left fallopian tube. Normal right fallopian tube. Right ovary surgically absent. 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   LEFT TUBE AND OVARY: -  Strumal carcinoid, 1.5 cm in greatest dimension. -  Unremarkable fallopian tube.  Note: The ovary consists of areas of a biphasic population of both thyroid  tissue (i.e. struma ovarii), confirmed with a TTF-1 stain and associated insular nests and trabecular cords of neuroendocrine cells with eosinophilic granular cytoplasm, round nuclei with stippled chromatin and a low mitotic activity (less than 2%), confirmed by positivity for synaptophysin and chromogranin (i.e. carcinoid). Additional immunohistochemical stains performed during the workup of the case include p63, Melan-A, inhibin, calretinin and GATA3 which are all negative.  The specimen is received fragmented and definitive evaluation for surface involvement cannot be accurately ascertained.  Dr. Frutoso has peer reviewed the case and agrees with the interpretation.  B.   RIGHT FALLOPIAN TUBE: -  Unremarkable fallopian tube.      Interval History: Pt reports that she is recovering well from  surgery. She is not needing  to take anything for pain. She is eating and drinking well. She is voiding without issue. Reports constipation. Takes dulcolax. Still have BM every other day. Urinary symptoms resolved. Having nightsweats.  Past Medical/Surgical History: Past Medical History:  Diagnosis Date   Anxiety    Atypical mole    moderte right upper buttock   Bipolar depression (HCC)    GERD (gastroesophageal reflux disease)    History of hiatal hernia    Hypercholesteremia    Hypertension    IBS (irritable bowel syndrome)    Insomnia    Low vitamin B12 level    Pneumonia    Primary hyperparathyroidism (HCC)    Tachycardia    mild   Tendinopathy of gluteal region     Past Surgical History:  Procedure Laterality Date   BREAST CYST EXCISION Right 2025   benign   ROBOTIC ASSISTED BILATERAL SALPINGO OOPHERECTOMY Bilateral 07/14/2024   Procedure: ROBOTIC ASSISTED LEFT SALPINGO-OOPHORECTOMY, RIGHT SALPINGECTOMY;  Surgeon: Eldonna Mays, MD;  Location: [facility];  Service: Gynecology;  Laterality: Bilateral;   SALPINGOOPHORECTOMY Right    ovarian teratoma, pfannenstiel   TONSILLECTOMY      Family History  Problem Relation Age of Onset   Breast cancer Mother    Lymphoma Father    Alzheimer's disease Father    Ovarian cancer Neg Hx    Colon cancer Neg Hx    Endometrial cancer Neg Hx     Social History   Socioeconomic History   Marital status: Married    Spouse name: Not on file   Number of children: Not on file   Years of education: Not on file   Highest education level: Not on file  Occupational History   Not on file  Tobacco Use   Smoking status: Never   Smokeless tobacco: Never  Vaping Use   Vaping status: Never Used  Substance and Sexual Activity   Alcohol use: Yes    Comment: occasionally   Drug use: No   Sexual activity: Not Currently  Other Topics Concern   Not on file  Social History Narrative   Not on file   Social Drivers of Health   Financial Resource Strain: Low Risk   (09/24/2023)   Received from [facility]   Overall Financial Resource Strain (CARDIA)    Difficulty of Paying Living Expenses: Not hard at all  Food Insecurity: No Food Insecurity (06/10/2024)   Hunger Vital Sign    Worried About Running Out of Food in the Last Year: Never true    Ran Out of Food in the Last Year: Never true  Transportation Needs: No Transportation Needs (06/10/2024)   PRAPARE - Administrator, Civil Service (Medical): No    Lack of Transportation (Non-Medical): No  Physical Activity: Insufficiently Active (09/24/2023)   Received from [facility]   Exercise Vital Sign    On average, how many days per week do you engage in moderate to strenuous exercise (like a brisk walk)?: 3 days    On average, how many minutes do you engage in exercise at this level?: 20 min  Stress: Stress Concern Present (09/24/2023)   Received from [facility] of Occupational Health - Occupational Stress Questionnaire    Feeling of Stress : To some extent  Social Connections: Socially Integrated (09/24/2023)   Received from [facility]   Social Network    How would you rate your social network (family, work, friends)?: Good  participation with social networks    Current Medications:  Current Outpatient Medications:    clonazePAM  (KLONOPIN ) 2 MG tablet, Take 2 mg by mouth at bedtime., Disp: , Rfl:    Cyanocobalamin  (VITAMIN B12) 1000 MCG TABS, Take 1,000 mcg by mouth daily., Disp: , Rfl:    escitalopram (LEXAPRO) 10 MG tablet, Take 10 mg by mouth daily., Disp: , Rfl:    lamoTRIgine  (LAMICTAL ) 200 MG tablet, Take 200 mg by mouth daily., Disp: , Rfl:    metoprolol  tartrate (LOPRESSOR ) 25 MG tablet, Take 25 mg by mouth 2 (two) times daily., Disp: , Rfl:    pantoprazole  (PROTONIX ) 40 MG tablet, Take 40 mg by mouth daily., Disp: , Rfl:    QUEtiapine  (SEROQUEL ) 200 MG tablet, Take 200 mg by mouth at bedtime., Disp: , Rfl:   Review of Symptoms: Complete 10-system  review is positive for: IBS, pelvic pain, fatigue, abdominal pain, hot flashes, swollen belly  Physical Exam: BP 128/72 Comment: manual recheck  Pulse 67   Temp 98.1 F (36.7 C) (Oral)   Resp 19   Wt 155 lb 6.4 oz (70.5 kg)   SpO2 100%   BMI 26.67 kg/m  General: Alert, oriented, no acute distress. HEENT: Normocephalic, atraumatic. Neck symmetric without masses. Sclera anicteric.  Chest: Normal work of breathing. Clear to auscultation bilaterally.   Cardiovascular: Regular rate and rhythm, no murmurs. Abdomen: Soft, mildly appropriately TTP.  Normoactive bowel sounds.  No masses appreciated.  Well-healing incisions with glue. Extremities: Grossly normal range of motion.  Warm, well perfused.  No edema bilaterally. Skin: No rashes or lesions noted.   Laboratory & Radiologic Studies: Surgical pathology (07/14/24): A.   LEFT TUBE AND OVARY: -  Strumal carcinoid, 1.5 cm in greatest dimension. -  Unremarkable fallopian tube.  Note: The ovary consists of areas of a biphasic population of both thyroid  tissue (i.e. struma ovarii), confirmed with a TTF-1 stain and associated insular nests and trabecular cords of neuroendocrine cells with eosinophilic granular cytoplasm, round nuclei with stippled chromatin and a low mitotic activity (less than 2%), confirmed by positivity for synaptophysin and chromogranin (i.e. carcinoid). Additional immunohistochemical stains performed during the workup of the case include p63, Melan-A, inhibin, calretinin and GATA3 which are all negative.  The specimen is received fragmented and definitive evaluation for surface involvement cannot be accurately ascertained.  Dr. Frutoso has peer reviewed the case and agrees with the interpretation.  B.   RIGHT FALLOPIAN TUBE: -  Unremarkable fallopian tube.

## 2024-07-28 ENCOUNTER — Encounter (HOSPITAL_COMMUNITY)
Admission: RE | Admit: 2024-07-28 | Discharge: 2024-07-28 | Disposition: A | Discharge status: Home / Self Care | Source: Ambulatory Visit | Attending: Psychiatry | Admitting: Psychiatry

## 2024-07-28 DIAGNOSIS — D3A098 Benign carcinoid tumors of other sites: Secondary | ICD-10-CM | POA: Insufficient documentation

## 2024-07-28 DIAGNOSIS — R935 Abnormal findings on diagnostic imaging of other abdominal regions, including retroperitoneum: Secondary | ICD-10-CM | POA: Diagnosis not present

## 2024-07-28 MED ORDER — COPPER CU 64 DOTATATE 1 MCI/ML IV SOLN
4.0000 | Freq: Once | INTRAVENOUS | Status: AC
  Administered 2024-07-28: 4.208 via INTRAVENOUS

## 2024-07-30 ENCOUNTER — Encounter: Payer: Self-pay | Admitting: Psychiatry

## 2024-08-01 ENCOUNTER — Ambulatory Visit: Payer: Self-pay | Admitting: Psychiatry

## 2024-08-03 ENCOUNTER — Inpatient Hospital Stay: Admitting: Psychiatry

## 2024-08-03 ENCOUNTER — Other Ambulatory Visit: Payer: Self-pay | Admitting: Oncology

## 2024-08-03 NOTE — Progress Notes (Signed)
 Gynecologic Oncology Multi-Disciplinary Disposition Conference Note  Date of the Conference: 08/03/2024  Patient Name: Catherine Fry  Referring Provider: Madolyn Monte, PA-C Primary GYN Oncologist: Dr. 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 Disposition is to close observation due to negative PET results.   This Multidisciplinary conference took place involving physicians from Gynecologic Oncology, Medical Oncology, Radiation Oncology, Pathology, Radiology along with the Gynecologic Oncology Nurse Practitioner and Gynecologic Oncology Nurse Navigator.  Comprehensive assessment of the patient's malignancy, staging, need for surgery, chemotherapy, radiation therapy, and need for further testing were reviewed. Supportive measures, both inpatient and following discharge were also discussed. The recommended plan of care is documented. Greater than 35 minutes were spent correlating and coordinating this patient's care.

## 2024-09-02 ENCOUNTER — Encounter: Payer: Self-pay | Admitting: Psychiatry

## 2024-09-02 ENCOUNTER — Telehealth: Payer: Self-pay | Admitting: *Deleted

## 2024-09-02 NOTE — Telephone Encounter (Signed)
 Patient called and stated that I'm very upset that I can't get this taken care of. I need an official document or the pathology to have an addendum that stated my tumor is cancer. [facility] will not take the message in my chart that is attached to the pathology report. Also the office note says typically and needs to state benign or malignant. When I send a my chart message to Dr Eldonna I except Dr Eldonna to answer me. I also am a black and white type of person. I need to know I do or don't have cancer. Is there any one there that can read and explain/interpret the pathology report to me. To show me where it say cancer.   Explained that there was no one in the office that can explain/interpret the pathology report for her. Explained that Dr Eldonna is at [facility] the rest of the week. Patient stated I want to talk with someone regarding the report and this issue this wee. Explained that the message would be given to [facility], APP and the office would call her back tomorrow after 12 pm, due to [facility], APP in the OR today and tomorrow morning.

## 2024-09-02 NOTE — Telephone Encounter (Signed)
 Spoke with Ms. Resler in regards to her [employer]. Pt is now aware she can print off the pathology and note attached from Dr. Eldonna from her MyChart and send this in to [facility]. Advised patient if she needs further assistance we can fax this information to [facility]. Pt thanked the office for clarifying and for calling back.

## 2024-10-12 NOTE — Progress Notes (Unsigned)
 [facility] [address] [place] [address] Phone: [phone] Subjective:   LILLETTE Claretha Schimke am a scribe for Dr. Claudene.   I'm seeing this patient by the request  of:  Tu, Ching T, DO  CC: bilateral hip pain   YEP:Dlagzrupcz  06/30/2024 Bilateral injections given today.  Patient will be undergoing the removal of a ovarian mass.  This has not happened in 2 weeks.  We discussed with patient that I want him to be active afterwards when possible.  Will patient understands this and will not be in the pool for 8 weeks till afterwards.  We discussed that I would like to see her about 8 weeks after the surgery to see how she is feeling.  Discussed hip abductor strengthening exercises when patient does feel better.  Discussed icing regimen.  Follow-up again 2 to 3 months.      Update 10/13/2024 CHRISTNE PLATTS is a 58 y.o. female coming in with complaint of B hip pain.  Seen nearly 3 months ago.  Patient states that if she doesn't take the meloxicam  they hurt. Today is a 7 out of 10 pain level.    Patient in August did have a PET scan that was unremarkable for any other type of cancer.  We have done laboratory workup previously showing some elevation in calcium and some decrease in GFR. Patient has made significant improvement with B12.  Past Medical History:  Diagnosis Date   Anxiety    Atypical mole    moderte right upper buttock   Bipolar depression (HCC)    GERD (gastroesophageal reflux disease)    History of hiatal hernia    Hypercholesteremia    Hypertension    IBS (irritable bowel syndrome)    Insomnia    Low vitamin B12 level    Pneumonia    Primary hyperparathyroidism    Tachycardia    mild   Tendinopathy of gluteal region    Past Surgical History:  Procedure Laterality Date   BREAST CYST EXCISION Right 2025   benign   ROBOTIC ASSISTED BILATERAL SALPINGO OOPHERECTOMY Bilateral 07/14/2024   Procedure: ROBOTIC ASSISTED LEFT  SALPINGO-OOPHORECTOMY, RIGHT SALPINGECTOMY;  Surgeon: Eldonna Mays, MD;  Location: [facility];  Service: Gynecology;  Laterality: Bilateral;   SALPINGOOPHORECTOMY Right    ovarian teratoma, pfannenstiel   TONSILLECTOMY     Social History   Socioeconomic History   Marital status: Married    Spouse name: Not on file   Number of children: Not on file   Years of education: Not on file   Highest education level: Not on file  Occupational History   Not on file  Tobacco Use   Smoking status: Never   Smokeless tobacco: Never  Vaping Use   Vaping status: Never Used  Substance and Sexual Activity   Alcohol use: Yes    Comment: occasionally   Drug use: No   Sexual activity: Not Currently  Other Topics Concern   Not on file  Social History Narrative   Not on file   Social Drivers of Health   Financial Resource Strain: Low Risk  (09/24/2023)   Received from [facility]   Overall Financial Resource Strain (CARDIA)    Difficulty of Paying Living Expenses: Not hard at all  Food Insecurity: No Food Insecurity (06/10/2024)   Hunger Vital Sign    Worried About Running Out of Food in the Last Year: Never true    Ran Out of Food in the  Last Year: Never true  Transportation Needs: No Transportation Needs (06/10/2024)   PRAPARE - Administrator, Civil Service (Medical): No    Lack of Transportation (Non-Medical): No  Physical Activity: Insufficiently Active (09/24/2023)   Received from [facility]   Exercise Vital Sign    On average, how many days per week do you engage in moderate to strenuous exercise (like a brisk walk)?: 3 days    On average, how many minutes do you engage in exercise at this level?: 20 min  Stress: Stress Concern Present (09/24/2023)   Received from [facility] - Occupational Stress Questionnaire    Feeling of Stress : To some extent  Social Connections: Socially Integrated (09/24/2023)   Received from [facility]   Social Network    How would you rate your social network (family, work, friends)?: Good participation with social networks   Allergies  Allergen Reactions   Minocycline  Other (See Comments)    Fever, outbreak    Family History  Problem Relation Age of Onset   Breast cancer Mother    Lymphoma Father    Alzheimer's disease Father    Ovarian cancer Neg Hx    Colon cancer Neg Hx    Endometrial cancer Neg Hx      Current Outpatient Medications (Cardiovascular):    metoprolol  tartrate (LOPRESSOR ) 25 MG tablet, Take 25 mg by mouth 2 (two) times daily.    Current Outpatient Medications (Hematological):    Cyanocobalamin  (VITAMIN B12) 1000 MCG TABS, Take 1,000 mcg by mouth daily.  Current Outpatient Medications (Other):    clonazePAM  (KLONOPIN ) 2 MG tablet, Take 2 mg by mouth at bedtime.   escitalopram (LEXAPRO) 10 MG tablet, Take 10 mg by mouth daily.   lamoTRIgine  (LAMICTAL ) 200 MG tablet, Take 200 mg by mouth daily.   pantoprazole  (PROTONIX ) 40 MG tablet, Take 40 mg by mouth daily.   QUEtiapine  (SEROQUEL ) 200 MG tablet, Take 200 mg by mouth at bedtime.   Reviewed prior external information including notes and imaging from  primary care provider As well as notes that were available from care everywhere and other healthcare systems.  Past medical history, social, surgical and family history all reviewed in electronic medical record.  No pertanent information unless stated regarding to the chief complaint.   Review of Systems:  No headache, visual changes, nausea, vomiting, diarrhea, constipation, dizziness, abdominal pain, skin rash, fevers, chills, night sweats, weight loss, swollen lymph nodes,  joint swelling, chest pain, shortness of breath, mood changes. POSITIVE muscle aches, body aches  Objective  Blood pressure (!) 140/70, pulse 98, height 5' 4 (1.626 m), weight 168 lb 12.8 oz (76.6 kg), SpO2 98%.   General: No apparent distress alert and oriented x3 mood and  affect normal, dressed appropriately.  HEENT: Pupils equal, extraocular movements intact  Respiratory: Patient's speak in full sentences and does not appear short of breath  Cardiovascular: No lower extremity edema, non tender, no erythema  Left hip exam shows tenderness to palpation still over the greater trochanteric area.  Some tightness in the gluteal area as well. Low back does have some mild loss of lordosis.   Impression and Recommendations:    The above documentation has been reviewed and is accurate and complete Geneva Cohick M Oaklee Sunga, DO

## 2024-10-13 ENCOUNTER — Ambulatory Visit: Admitting: Family Medicine

## 2024-10-13 VITALS — BP 140/70 | HR 98 | Ht 64.0 in | Wt 168.8 lb

## 2024-10-13 DIAGNOSIS — M25551 Pain in right hip: Secondary | ICD-10-CM

## 2024-10-13 DIAGNOSIS — M25552 Pain in left hip: Secondary | ICD-10-CM

## 2024-10-13 NOTE — Patient Instructions (Addendum)
 Good to see you. Essential Amino acids 2 to 5 gs. Ask other doc about Cymbalta. Stay active. See me again in 3 months.

## 2024-10-13 NOTE — Assessment & Plan Note (Signed)
 Continues to have some bilateral hip pain but seems to be left greater than right.  Not severe enough for another injection.  Discussed continuing to work on core strengthening.  Patient was found to have some hypercalcemia and did discuss we should potentially consider repeating in 6 months.  In addition to that we did discuss other things that could be contributing.  We discussed the potential change from Lexapro to Cymbalta which could help with pain as well as with any osteoarthritic pain.  Patient will follow-up again in 3 months  I personally spent a total of 33 minutes in the care of the patient today including preparing to see the patient, getting/reviewing separately obtained history, performing a medically appropriate exam/evaluation, counseling and educating, documenting clinical information in the EHR, independently interpreting results, communicating results, and coordinating care.

## 2024-10-27 DIAGNOSIS — R002 Palpitations: Secondary | ICD-10-CM | POA: Diagnosis not present

## 2024-10-27 DIAGNOSIS — I10 Essential (primary) hypertension: Secondary | ICD-10-CM | POA: Diagnosis not present

## 2024-10-27 NOTE — Progress Notes (Signed)
 "  Outpatient Cardiology Consultation   HPI    History of Present Illness:  Catherine Fry is a 58 y.o. female who presents for establish care.  Previously followed with Dr. Rogena to be seen here due to proximity to her home. She has been evaluated for palpitations-  Prior workup: Holter in 2018 showed sinus rhythm and sinus tachycardia with average heart rate of 101.  Isolated but occasional PVCs and rare PACs.  Stress echo was negative for ischemia, LV function normal.  Repeat echo in 2024 demonstrated normal LV systolic function, normal diastolic function, healthy valves. Repeat Holter monitor in 2024 demonstrated normal heart rate distribution, rare PVCs and rare PACs, no arrhythmias.  Generally doing well today, occasional flutters in her chest.  No exertional chest pain or dyspnea.  She does not smoke or drink excessive alcohol.  Family history of CVA.  Father has pacemaker due to tachybradycardia syndrome.  She has questions regarding results of recent echo and cardiac monitor.  Exam  Heart Rate:  [105] 105 BP: (130)/(70) 130/70 SpO2:  [98 %] 98 %  Review of Systems  Constitutional:  Negative for chills and fatigue.  Respiratory:  Negative for chest tightness and shortness of breath.   Cardiovascular:  Negative for chest pain and palpitations.  Neurological:  Negative for dizziness and syncope.    Physical Exam Constitutional:      General: She is not in acute distress. HENT:     Head: Normocephalic and atraumatic.  Cardiovascular:     Rate and Rhythm: Normal rate and regular rhythm.     Pulses: Normal pulses.     Heart sounds: No murmur heard. Musculoskeletal:     Right lower leg: No edema.     Left lower leg: No edema.  Pulmonary:     Effort: Pulmonary effort is normal.     Breath sounds: No wheezing.  Skin:    General: Skin is warm and dry.  Neurological:     General: No focal deficit present.     Mental Status: She is alert and oriented to  person, place, and time.  Psychiatric:        Mood and Affect: Mood normal.        Behavior: Behavior normal.      Assessment/Plan   I have personally reviewed the patients active medications.  I have reviewed prior family medicine/sports medicine, oncology note(s) Prior studies independently reviewed and interpreted by me:  Personally reviewed and interpreted recent heart monitor-normal.  Rare PVCs and PACs.  No symptoms reported.  I have reviewed the following:   Results - Labs:   - Creatinine: 1.16 mg/dL   - Potassium: 3.7 mmol/L  - Imaging:   - Coronary calcium score: 03/2024, Zero     Assessment & Plan 1. Palpitations: Stable. Heart monitor showed occasional skipped beats, but nothing significant. - Prescription for metoprolol succinate 50 mg once daily will be provided, with the option to adjust the dosage to 25 mg twice daily if necessary. - Engage in approximately 30 minutes of heart rate-elevating activity most days. If not currently feasible, gradually increase the frequency to 2 to 3 days per week. - Contact the office immediately if experiencing symptoms such as chest pain, palpitations, tightness, pressure, or shortness of breath.  2 ASCVD risk-low.  CAC score 0.  Follow-up - Follow up in 1 year.     Results  ECG: No results found.  Echo: No results found.    Electronically Signed: Redell LABOR  Isabell, MD 10/27/2024 12:12 PM  "

## 2024-12-17 ENCOUNTER — Telehealth: Payer: Self-pay | Admitting: *Deleted

## 2024-12-17 NOTE — Telephone Encounter (Signed)
 Spoke with patient who states she has been having intermittent abdominal pain that started in November and intermittent nausea in the morning with dry heaves only for the past 4 weeks.  Patient reports the pain is more sharp in nature, not cramping. It's not constant, comes and goes. It may not appear for a few weeks then comes back.  Activity doesn't make it worse or better.  She notices the pain more when she is still. The nausea she is experiencing is sudden and only happens in the morning before eating. Pt also states she is having hot flashes  since her surgery as well.  Pt has history of IBS. Reports having a bowel movement every 3rd or 4th. Day. Pt denies fever and or chills. Pt is no longer taking her linzess and her gastroenterologist Dr. Burnette advised her to take dulcolax daily. Pt states her symptoms are not urgent and just wants Dr. Eldonna to be aware prior to her next follow up appointment in early March.   Advised patient to follow up with Dr. Burnette and her message will be relayed to providers. If any new recommendations the office will call back.

## 2024-12-17 NOTE — Telephone Encounter (Signed)
 Per provider moved appt from 2/23 to 3/9. Patient aware

## 2024-12-21 ENCOUNTER — Other Ambulatory Visit: Payer: Self-pay | Admitting: Psychiatry

## 2024-12-21 DIAGNOSIS — C7A098 Malignant carcinoid tumors of other sites: Secondary | ICD-10-CM

## 2024-12-22 NOTE — Telephone Encounter (Signed)
 Attempted to reach patient to relay message from provider. Left voicemail requesting call back to [phone].

## 2024-12-23 NOTE — Telephone Encounter (Signed)
 Spoke with patient who agreed to the dotatate PET scan Dr. Eldonna ordered.  Advised patient that nuclear medicine will call and get that scheduled once they have received authorization. Pt verbalized understanding and thanked the office for calling.

## 2025-01-04 ENCOUNTER — Encounter (HOSPITAL_COMMUNITY)

## 2025-01-07 NOTE — Progress Notes (Signed)
 " [facility] [address] [place] [address] Phone: [phone] Subjective:   Catherine Fry am a scribe for Dr. Claudene.   I'm seeing this patient by the request  of:  Tu, Ching T, DO  CC: Bilateral hip pain  YEP:Dlagzrupcz  10/13/2024 Continues to have some bilateral hip pain but seems to be left greater than right.  Not severe enough for another injection.  Discussed continuing to work on core strengthening.  Patient was found to have some hypercalcemia and did discuss we should potentially consider repeating in 6 months.  In addition to that we did discuss other things that could be contributing.  We discussed the potential change from Lexapro to Cymbalta which could help with pain as well as with any osteoarthritic pain.  Patient will follow-up again in 3 months   I personally spent a total of 33 minutes in the care of the patient today including preparing to see the patient, getting/reviewing separately obtained history, performing a medically appropriate exam/evaluation, counseling and educating, documenting clinical information in the EHR, independently interpreting results, communicating results, and coordinating care.        Update 01/13/2025 Catherine Fry is a 59 y.o. female coming in with complaint of B hip pain.  Seem to be more greater trochanteric bursitis.  Given injection was back in July.  Patient was to continue conservative therapy.  We did discuss different medications.  Patient states that the hips are pretty sore today. Would like to talk to the doctor about other hip related things.  Patient states that she is doing better overall when it comes to the pain that is manageable.  Still does not feel like herself low.  Still some fatigue and has had some weight gain.   Past medical history is significant for ovarian cancer and is scheduled to have another PET scan in 2 weeks.  Most recent laboratory workup did show some elevation in her  calcium  Past Medical History:  Diagnosis Date   Anxiety    Atypical mole    moderte right upper buttock   Bipolar depression (HCC)    GERD (gastroesophageal reflux disease)    History of hiatal hernia    Hypercholesteremia    Hypertension    IBS (irritable bowel syndrome)    Insomnia    Low vitamin B12 level    Pneumonia    Primary hyperparathyroidism    Tachycardia    mild   Tendinopathy of gluteal region    Past Surgical History:  Procedure Laterality Date   BREAST CYST EXCISION Right 2025   benign   ROBOTIC ASSISTED BILATERAL SALPINGO OOPHERECTOMY Bilateral 07/14/2024   Procedure: ROBOTIC ASSISTED LEFT SALPINGO-OOPHORECTOMY, RIGHT SALPINGECTOMY;  Surgeon: Eldonna Mays, MD;  Location: [facility];  Service: Gynecology;  Laterality: Bilateral;   SALPINGOOPHORECTOMY Right    ovarian teratoma, pfannenstiel   TONSILLECTOMY     Social History   Socioeconomic History   Marital status: Married    Spouse name: Not on file   Number of children: Not on file   Years of education: Not on file   Highest education level: Not on file  Occupational History   Not on file  Tobacco Use   Smoking status: Never   Smokeless tobacco: Never  Vaping Use   Vaping status: Never Used  Substance and Sexual Activity   Alcohol use: Yes    Comment: occasionally   Drug use: No   Sexual activity: Not Currently  Other Topics Concern   Not on file  Social History Narrative   Not on file   Social Drivers of Health   Tobacco Use: Low Risk (10/27/2024)   Received from [facility]   Patient History    Smoking Tobacco Use: Never    Smokeless Tobacco Use: Never    Passive Exposure: Not on file  Financial Resource Strain: Low Risk (09/24/2023)   Received from [facility]   Overall Financial Resource Strain (CARDIA)    Difficulty of Paying Living Expenses: Not hard at all  Food Insecurity: No Food Insecurity (06/10/2024)   Epic    Worried About Radiation Protection Practitioner of Food in the Last Year: Never  true    Ran Out of Food in the Last Year: Never true  Transportation Needs: No Transportation Needs (06/10/2024)   Epic    Lack of Transportation (Medical): No    Lack of Transportation (Non-Medical): No  Physical Activity: Insufficiently Active (09/24/2023)   Received from [facility]   Exercise Vital Sign    On average, how many days per week do you engage in moderate to strenuous exercise (like a brisk walk)?: 3 days    On average, how many minutes do you engage in exercise at this level?: 20 min  Stress: Stress Concern Present (09/24/2023)   Received from [facility] - Occupational Stress Questionnaire    Feeling of Stress : To some extent  Social Connections: Socially Integrated (09/24/2023)   Received from [facility]   Social Network    How would you rate your social network (family, work, friends)?: Good participation with social networks  Depression (PHQ2-9): Low Risk (06/10/2024)   Depression (PHQ2-9)    PHQ-2 Score: 0  Alcohol Screen: Not on file  Housing: Unknown (06/10/2024)   Epic    Unable to Pay for Housing in the Last Year: No    Number of Times Moved in the Last Year: Not on file    Homeless in the Last Year: No  Utilities: Not At Risk (06/10/2024)   Epic    Threatened with loss of utilities: No  Health Literacy: Not on file   Allergies[1] Family History  Problem Relation Age of Onset   Breast cancer Mother    Lymphoma Father    Alzheimer's disease Father    Ovarian cancer Neg Hx    Colon cancer Neg Hx    Endometrial cancer Neg Hx     Current Outpatient Medications (Cardiovascular):    metoprolol tartrate (LOPRESSOR) 25 MG tablet, Take 25 mg by mouth 2 (two) times daily.  Current Outpatient Medications (Hematological):    Cyanocobalamin  (VITAMIN B12) 1000 MCG TABS, Take 1,000 mcg by mouth daily.  Current Outpatient Medications (Other):    clonazePAM (KLONOPIN) 2 MG tablet, Take 2 mg by mouth at bedtime.    escitalopram (LEXAPRO) 10 MG tablet, Take 10 mg by mouth daily.   lamoTRIgine (LAMICTAL) 200 MG tablet, Take 200 mg by mouth daily.   pantoprazole (PROTONIX) 40 MG tablet, Take 40 mg by mouth daily.   QUEtiapine (SEROQUEL) 200 MG tablet, Take 200 mg by mouth at bedtime.   Reviewed prior external information including notes and imaging from  primary care provider As well as notes that were available from care everywhere and other healthcare systems.  Past medical history, social, surgical and family history all reviewed in electronic medical record.  No pertanent information unless stated regarding to the chief complaint.   Review  of Systems:  No headache, visual changes, nausea, vomiting, diarrhea, constipation, dizziness skin rash, fevers, chills, night sweats, weight loss, swollen lymph nodes, body aches, joint swelling, chest pain, shortness of breath, mood changes. POSITIVE muscle aches, abdominal pain, weight gain  Objective  Blood pressure 130/70, pulse 94, height 5' 4 (1.626 m), weight 179 lb (81.2 kg), last menstrual period 09/10/2016, SpO2 99%.   General: No apparent distress alert and oriented x3 mood and affect normal, dressed appropriately.  HEENT: Pupils equal, extraocular movements intact  Respiratory: Patient's speak in full sentences and does not appear short of breath  Cardiovascular: No lower extremity edema, non tender, no erythema  Patient mild tightness with FABER test.  Sitting relatively comfortable.  Able to get out of a seated position without any significant difficulty.  Negative straight leg test noted.    Impression and Recommendations:      The above documentation has been reviewed and is accurate and complete Catherine CHRISTELLA Sharps, DO      [1]  Allergies Allergen Reactions   Minocycline  Other (See Comments)    Fever, outbreak    "

## 2025-01-13 ENCOUNTER — Ambulatory Visit: Admitting: Family Medicine

## 2025-01-13 ENCOUNTER — Ambulatory Visit

## 2025-01-13 VITALS — BP 130/70 | HR 94 | Ht 64.0 in | Wt 179.0 lb

## 2025-01-13 DIAGNOSIS — M25559 Pain in unspecified hip: Secondary | ICD-10-CM

## 2025-01-13 DIAGNOSIS — M25552 Pain in left hip: Secondary | ICD-10-CM

## 2025-01-13 DIAGNOSIS — M545 Low back pain, unspecified: Secondary | ICD-10-CM

## 2025-01-13 DIAGNOSIS — R5383 Other fatigue: Secondary | ICD-10-CM | POA: Diagnosis not present

## 2025-01-13 DIAGNOSIS — C7A098 Malignant carcinoid tumors of other sites: Secondary | ICD-10-CM | POA: Diagnosis not present

## 2025-01-13 DIAGNOSIS — M25551 Pain in right hip: Secondary | ICD-10-CM | POA: Diagnosis not present

## 2025-01-13 LAB — COMPREHENSIVE METABOLIC PANEL WITH GFR
ALT: 18 U/L (ref 3–35)
AST: 22 U/L (ref 5–37)
Albumin: 4.5 g/dL (ref 3.5–5.2)
Alkaline Phosphatase: 71 U/L (ref 39–117)
BUN: 15 mg/dL (ref 6–23)
CO2: 24 meq/L (ref 19–32)
Calcium: 10.4 mg/dL (ref 8.4–10.5)
Chloride: 102 meq/L (ref 96–112)
Creatinine, Ser: 1.06 mg/dL (ref 0.40–1.20)
GFR: 57.23 mL/min — ABNORMAL LOW
Glucose, Bld: 88 mg/dL (ref 70–99)
Potassium: 3.8 meq/L (ref 3.5–5.1)
Sodium: 137 meq/L (ref 135–145)
Total Bilirubin: 0.5 mg/dL (ref 0.2–1.2)
Total Protein: 7 g/dL (ref 6.0–8.3)

## 2025-01-13 LAB — CBC WITH DIFFERENTIAL/PLATELET
Basophils Absolute: 0.1 10*3/uL (ref 0.0–0.1)
Basophils Relative: 2.4 % (ref 0.0–3.0)
Eosinophils Absolute: 0.2 10*3/uL (ref 0.0–0.7)
Eosinophils Relative: 3.3 % (ref 0.0–5.0)
HCT: 38.2 % (ref 36.0–46.0)
Hemoglobin: 12.8 g/dL (ref 12.0–15.0)
Lymphocytes Relative: 23.5 % (ref 12.0–46.0)
Lymphs Abs: 1.4 10*3/uL (ref 0.7–4.0)
MCHC: 33.6 g/dL (ref 30.0–36.0)
MCV: 87.3 fl (ref 78.0–100.0)
Monocytes Absolute: 0.3 10*3/uL (ref 0.1–1.0)
Monocytes Relative: 5.8 % (ref 3.0–12.0)
Neutro Abs: 3.8 10*3/uL (ref 1.4–7.7)
Neutrophils Relative %: 65 % (ref 43.0–77.0)
Platelets: 281 10*3/uL (ref 150.0–400.0)
RBC: 4.37 Mil/uL (ref 3.87–5.11)
RDW: 13.4 % (ref 11.5–15.5)
WBC: 5.8 10*3/uL (ref 4.0–10.5)

## 2025-01-13 LAB — SEDIMENTATION RATE: Sed Rate: 9 mm/h (ref 0–30)

## 2025-01-13 LAB — TSH: TSH: 2.65 u[IU]/mL (ref 0.35–5.50)

## 2025-01-13 NOTE — Assessment & Plan Note (Signed)
 Will check some markers at this time.  Patient has had weight gain, is on Seroquel and could be this potentially.  Following up and getting a PET scan in the near future.  Follow-up with me in 3 months otherwise.

## 2025-01-13 NOTE — Assessment & Plan Note (Signed)
 Will get x-rays at this time.  Hold on any other injection with patient doing well.  Continue to monitor weight gain.  Laboratory workup for the hypercalcemia also done today.  Follow-up again in 6 to 8 weeks otherwise.

## 2025-01-13 NOTE — Patient Instructions (Signed)
 Talk to other providers are increasing Cymbalta Keep working on hips Labs and Xray today See me in 3 months but write me if you have questions

## 2025-01-14 ENCOUNTER — Ambulatory Visit: Payer: Self-pay | Admitting: Family Medicine

## 2025-01-14 LAB — CA 125: CA 125: 8 U/mL

## 2025-01-14 LAB — CEA: CEA: <2 ng/mL

## 2025-01-15 LAB — PTH, INTACT AND CALCIUM
Calcium: 9.8 mg/dL (ref 8.6–10.4)
PTH: 67 pg/mL (ref 16–77)

## 2025-01-26 ENCOUNTER — Ambulatory Visit (HOSPITAL_COMMUNITY)

## 2025-02-01 ENCOUNTER — Ambulatory Visit: Admitting: Psychiatry

## 2025-02-15 ENCOUNTER — Inpatient Hospital Stay: Admitting: Psychiatry

## 2025-04-12 ENCOUNTER — Ambulatory Visit: Admitting: Family Medicine
